# Patient Record
Sex: Male | Born: 1980 | Race: White | Hispanic: No | Marital: Married | Smoking: Never smoker
Health system: Southern US, Community
[De-identification: ages and names within clinical notes are randomized; demographics above are authoritative.]

## PROBLEM LIST (undated history)

## (undated) DIAGNOSIS — E785 Hyperlipidemia, unspecified: Secondary | ICD-10-CM

## (undated) DIAGNOSIS — I10 Essential (primary) hypertension: Secondary | ICD-10-CM

## (undated) HISTORY — PX: TONSILLECTOMY: SUR1361

## (undated) HISTORY — DX: Hyperlipidemia, unspecified: E78.5

## (undated) HISTORY — DX: Essential (primary) hypertension: I10

---

## 2007-05-07 ENCOUNTER — Encounter: Payer: Self-pay | Admitting: Family Medicine

## 2007-06-02 ENCOUNTER — Ambulatory Visit: Payer: Self-pay | Admitting: Family Medicine

## 2007-06-02 DIAGNOSIS — E785 Hyperlipidemia, unspecified: Secondary | ICD-10-CM | POA: Insufficient documentation

## 2007-06-02 DIAGNOSIS — I10 Essential (primary) hypertension: Secondary | ICD-10-CM | POA: Insufficient documentation

## 2007-06-02 LAB — CONVERTED CEMR LAB
CO2: 28 meq/L (ref 19–32)
GFR calc Af Amer: 130 mL/min
GFR calc non Af Amer: 108 mL/min
Glucose, Bld: 99 mg/dL (ref 70–99)
Sodium: 139 meq/L (ref 135–145)

## 2007-11-25 ENCOUNTER — Ambulatory Visit: Payer: Self-pay | Admitting: Family Medicine

## 2007-11-30 ENCOUNTER — Ambulatory Visit: Payer: Self-pay | Admitting: Family Medicine

## 2007-12-03 LAB — CONVERTED CEMR LAB: Direct LDL: 85.2 mg/dL

## 2008-05-30 ENCOUNTER — Ambulatory Visit: Payer: Self-pay | Admitting: Family Medicine

## 2008-05-30 LAB — CONVERTED CEMR LAB
Cholesterol, target level: 200 mg/dL
LDL Goal: 130 mg/dL

## 2008-09-05 ENCOUNTER — Ambulatory Visit: Payer: Self-pay | Admitting: Family Medicine

## 2008-09-06 ENCOUNTER — Encounter (INDEPENDENT_AMBULATORY_CARE_PROVIDER_SITE_OTHER): Payer: Self-pay | Admitting: *Deleted

## 2008-09-06 LAB — CONVERTED CEMR LAB
CO2: 28 meq/L (ref 19–32)
Calcium: 9.6 mg/dL (ref 8.4–10.5)
Chloride: 106 meq/L (ref 96–112)
Cholesterol: 166 mg/dL (ref 0–200)
Creatinine, Ser: 1.1 mg/dL (ref 0.4–1.5)
Glucose, Bld: 92 mg/dL (ref 70–99)
Sodium: 140 meq/L (ref 135–145)
Total CHOL/HDL Ratio: 5
Triglycerides: 284 mg/dL — ABNORMAL HIGH (ref 0.0–149.0)

## 2008-12-12 ENCOUNTER — Ambulatory Visit: Payer: Self-pay | Admitting: Family Medicine

## 2008-12-14 LAB — CONVERTED CEMR LAB
Cholesterol: 142 mg/dL (ref 0–200)
LDL Cholesterol: 79 mg/dL (ref 0–99)
Total CHOL/HDL Ratio: 4
Triglycerides: 123 mg/dL (ref 0.0–149.0)
VLDL: 24.6 mg/dL (ref 0.0–40.0)

## 2010-02-05 ENCOUNTER — Emergency Department: Payer: Self-pay | Admitting: Emergency Medicine

## 2010-02-06 ENCOUNTER — Telehealth (INDEPENDENT_AMBULATORY_CARE_PROVIDER_SITE_OTHER): Payer: Self-pay | Admitting: *Deleted

## 2010-02-16 ENCOUNTER — Telehealth (INDEPENDENT_AMBULATORY_CARE_PROVIDER_SITE_OTHER): Payer: Self-pay | Admitting: *Deleted

## 2010-02-19 ENCOUNTER — Ambulatory Visit: Payer: Self-pay | Admitting: Family Medicine

## 2010-02-20 ENCOUNTER — Ambulatory Visit: Payer: Self-pay | Admitting: Family Medicine

## 2010-02-20 DIAGNOSIS — E875 Hyperkalemia: Secondary | ICD-10-CM | POA: Insufficient documentation

## 2010-02-21 LAB — CONVERTED CEMR LAB
ALT: 41 units/L (ref 0–53)
AST: 25 units/L (ref 0–37)
Alkaline Phosphatase: 70 units/L (ref 39–117)
Bilirubin, Direct: 0.1 mg/dL (ref 0.0–0.3)
CO2: 27 meq/L (ref 19–32)
Chloride: 101 meq/L (ref 96–112)
Cholesterol: 155 mg/dL (ref 0–200)
Potassium: 5.4 meq/L — ABNORMAL HIGH (ref 3.5–5.1)
Total Bilirubin: 0.7 mg/dL (ref 0.3–1.2)
Total CHOL/HDL Ratio: 4
Total Protein: 7.2 g/dL (ref 6.0–8.3)
VLDL: 50.8 mg/dL — ABNORMAL HIGH (ref 0.0–40.0)

## 2010-02-22 LAB — CONVERTED CEMR LAB
BUN: 14 mg/dL (ref 6–23)
CO2: 27 meq/L (ref 19–32)
Calcium: 9.9 mg/dL (ref 8.4–10.5)
Creatinine, Ser: 1 mg/dL (ref 0.4–1.5)
GFR calc non Af Amer: 89.91 mL/min (ref >60)
Glucose, Bld: 94 mg/dL (ref 70–99)

## 2010-07-03 NOTE — Assessment & Plan Note (Signed)
Summary: CPX/CLE   Vital Signs:  Patient profile:   30 year old male Height:      71.5 inches Weight:      253.0 pounds BMI:     34.92 Temp:     98.3 degrees F oral Pulse rate:   92 / minute Pulse rhythm:   regular BP sitting:   110 / 80  (left arm) Cuff size:   large  Vitals Entered By: Benny Lennert CMA [facility] Dull) (February 20, 2010 2:47 PM)  History of Present Illness: Chief complaint  The patient is here for annual wellness exam and preventative care.     HTN, well controlled today..did not take lisinopril/HCTZ.  Last check 135/73 at home.   High cholesterol: Reviewed labs in detail with pt. Well controlled on fenofibrate. Triglycerides high again.  Just restarted fenofibrate.   Hyperkalemia..not on any supplements.no bannanas, has been trying to eat more veggies.   5lbs weight gain in last year. Not exercsiing..plans to start back.   Problems Prior to Update: 1)  Hyperkalemia  (ICD-276.7) 2)  Hypertension  (ICD-401.9) 3)  Hyperlipidemia  (ICD-272.4)  Current Medications (verified): 1)  Lisinopril-Hydrochlorothiazide 20-12.5 Mg  Tabs (Lisinopril-Hydrochlorothiazide) .... Take 1 Tablet By Mouth Once A Day 2)  Fish Oil   Oil (Fish Oil) .... Take 1 Capsule By Mouth Three Times A Day 3)  Antihistamine 30-2.5 Mg  Tabs (Triprolidine-Pseudoephedrine) .... Take 1 Tablet By Mouth Once A Day (Seasonal) 4)  Fenofibrate 160 Mg Tabs (Fenofibrate) .... Take 1 Tab By Mouth At Bedtime  Allergies (verified): No Known Drug Allergies  Past History:  Past medical, surgical, family and social histories (including risk factors) reviewed, and no changes noted (except as noted below).  Past Medical History: Reviewed history from 06/02/2007 and no changes required. Hyperlipidemia Hypertension  Past Surgical History: Reviewed history from 06/02/2007 and no changes required. pneumonia as a child 3 times Tonsillectomy  Family History: Reviewed history from 06/02/2007 and no  changes required. father CAD, stent placed, HTN, high chol,  mothers: healthy MGF: DM PGF: HTN, CAD MGF: pancreatic cancer MGM: Alzheimer's brother: HTN  Social History: Reviewed history from 06/02/2007 and no changes required. Occupation:golf professional at [employer] Single Current Smoker, chews tobacco Alcohol use-yes, 3-4 beers per week Drug use-no Regular exercise-yes, cardio 4 x per week  Review of Systems General:  Denies fatigue and fever. CV:  Denies chest pain or discomfort. Resp:  Denies shortness of breath. GI:  Denies abdominal pain, bloody stools, constipation, and diarrhea. GU:  Denies dysuria. Derm:  Denies lesion(s). Psych:  Denies anxiety and depression.  Physical Exam  General:  overweight appearing male in NAD Eyes:  No corneal or conjunctival inflammation noted. EOMI. Perrla. Funduscopic exam benign, without hemorrhages, exudates or papilledema. Vision grossly normal. Ears:  External ear exam shows no significant lesions or deformities.  Otoscopic examination reveals clear canals, tympanic membranes are intact bilaterally without bulging, retraction, inflammation or discharge. Hearing is grossly normal bilaterally. Nose:  External nasal examination shows no deformity or inflammation. Nasal mucosa are pink and moist without lesions or exudates. Mouth:  Oral mucosa and oropharynx without lesions or exudates.  Teeth in good repair. Neck:  no carotid bruit or thyromegaly no cervical or supraclavicular lymphadenopathy  Lungs:  Normal respiratory effort, chest expands symmetrically. Lungs are clear to auscultation, no crackles or wheezes. Heart:  Normal rate and regular rhythm. S1 and S2 normal without gallop, murmur, click, rub or other extra sounds. Abdomen:  Bowel sounds positive,abdomen soft  and non-tender without masses, organomegaly or hernias noted. Genitalia:  Testes bilaterally descended without nodularity, tenderness or masses. No scrotal masses or  lesions. No penis lesions or urethral discharge. Msk:  No deformity or scoliosis noted of thoracic or lumbar spine.   Pulses:  R and L posterior tibial pulses are full and equal bilaterally  Extremities:  no edema  Neurologic:  No cranial nerve deficits noted. Station and gait are normal. Plantar reflexes are down-going bilaterally. DTRs are symmetrical throughout. Sensory, motor and coordinative functions appear intact. Skin:  Intact without suspicious lesions or rashes Psych:  Cognition and judgment appear intact. Alert and cooperative with normal attention span and concentration. No apparent delusions, illusions, hallucinations   Impression & Recommendations:  Problem # 1:  Preventive Health Care (ICD-V70.0) The patient's preventative maintenance and recommended screening tests for an annual wellness exam were reviewed in full today. Brought up to date unless services declined.  Counselled on the importance of diet, exercise, and its role in overall health and mortality. The patient's FH and SH was reviewed, including their home life, tobacco status, and drug and alcohol status.     Problem # 2:  HYPERTENSION (ICD-401.9)  Well controlled. Continue current medication. Follow closely.Marland Kitchenif running low on BP med consider decreasing med/D/C. Folow up in 6 months.  Encouraged exercise, weight loss, healthy eating habits.  His updated medication list for this problem includes:    Lisinopril-hydrochlorothiazide 20-12.5 Mg Tabs (Lisinopril-hydrochlorothiazide) .Marland Kitchen... Take 1 tablet by mouth once a day  His updated medication list for this problem includes:    Lisinopril-hydrochlorothiazide 20-12.5 Mg Tabs (Lisinopril-hydrochlorothiazide) .Marland Kitchen... Take 1 tablet by mouth once a day  Problem # 3:  HYPERLIPIDEMIA (ICD-272.4)  Inadequate control. Restart med. Recheck in 1 year.  His updated medication list for this problem includes:    Fenofibrate 160 Mg Tabs (Fenofibrate) .Marland Kitchen... Take 1 tab by mouth  at bedtime  Labs Reviewed: SGOT: 23 (12/12/2008)   SGPT: 27 (12/12/2008)  Lipid Goals: Chol Goal: 200 (05/30/2008)   HDL Goal: 40 (05/30/2008)   LDL Goal: 130 (05/30/2008)   TG Goal: 150 (05/30/2008)  Prior 10 Yr Risk Heart Disease: 4 % (05/30/2008)   HDL:38.00 (12/12/2008), 36.40 (09/05/2008)  LDL:79 (12/12/2008), DEL (16/03/9603)  Chol:142 (12/12/2008), 166 (09/05/2008)  Trig:123.0 (12/12/2008), 284.0 (09/05/2008)  His updated medication list for this problem includes:    Fenofibrate 160 Mg Tabs (Fenofibrate) .Marland Kitchen... Take 1 tab by mouth at bedtime  Problem # 4:  HYPERKALEMIA (ICD-276.7) ? lisinopril cause. Recheck today.  May need kayexylate.  Orders: TLB-BMP (Basic Metabolic Panel-BMET) (80048-METABOL)  Complete Medication List: 1)  Lisinopril-hydrochlorothiazide 20-12.5 Mg Tabs (Lisinopril-hydrochlorothiazide) .... Take 1 tablet by mouth once a day 2)  Fish Oil Oil (Fish oil) .... Take 1 capsule by mouth three times a day 3)  Antihistamine 30-2.5 Mg Tabs (Triprolidine-pseudoephedrine) .... Take 1 tablet by mouth once a day (seasonal) 4)  Fenofibrate 160 Mg Tabs (Fenofibrate) .... Take 1 tab by mouth at bedtime  Patient Instructions: 1)  Get back on track with exercise. 2)   Work on healthy low fat eating.  3)  Bring in BP measurement record to next appt.  4)  Please schedule a follow-up appointment in 6 months HTN check. 5)  We will call with potassium results to let you know if you need to change BP meds or take medicaiton to flush out potassium from you system.  6)    Prescriptions: FENOFIBRATE 160 MG TABS (FENOFIBRATE) Take 1 tab by mouth at  bedtime  #30 x 11   Entered and Authorized by:   Kerby Nora MD   Signed by:   Kerby Nora MD on 02/20/2010   Method used:   Electronically to        [facility]* (retail)       [address]       [place], [place]  [address]       Ph: [phone]       Fax: [phone]   RxID:   [id number] LISINOPRIL-HYDROCHLOROTHIAZIDE  20-12.5 MG  TABS (LISINOPRIL-HYDROCHLOROTHIAZIDE) Take 1 tablet by mouth once a day  #30 x 11   Entered and Authorized by:   Kerby Nora MD   Signed by:   Kerby Nora MD on 02/20/2010   Method used:   Electronically to        [facility]* (retail)       [address]       [place], [place]  [address]       Ph: [phone]       Fax: [phone]   RxID:   [id number]   Current Allergies (reviewed today): No known allergies   Flu Vaccine Next Due:  Refused TD Result Date:  01/01/2010 TD Result:  in ER  TD Next Due:  10 yr

## 2010-07-03 NOTE — Progress Notes (Signed)
Summary: FENOFIBRATE  Phone Note Refill Request Message from:  [facility] on February 06, 2010 11:42 AM  Refills Requested: Medication #1:  FENOFIBRATE 160 MG TABS Take 1 tab by mouth at bedtime.   Last Refilled: 06/24/2009 pt hasn't been seen since 2009, ok to refill?   Method Requested: Electronic Initial call taken by: Mervin Hack CMA Duncan Dull),  February 06, 2010 11:42 AM  Follow-up for Phone Call        Overdue for CPX with fasting lipids, CMET Dx 272.0 prior. Refill only untill date of appt made.  Follow-up by: Kerby Nora MD,  February 06, 2010 1:50 PM  Additional Follow-up for Phone Call Additional follow up Details #1::        Message left on cell phone voicemail advising patient as instructed.  Will wait to hear from him before medication is refilled.  Linde Gillis CMA [facility] Dull)  February 07, 2010 12:27 PM  Left another message on patient's cell phone to call back.  Sydell Axon LPN  February 08, 2010 11:14 AM Left message on mail to call back and schedule CPx. Kim Dance CMA [facility] Dull)  February 09, 2010 2:52 PM     Additional Follow-up for Phone Call Additional follow up Details #2::    Patient advised and transfered up front to schedule appt refilled for 1 month only Follow-up by: Benny Lennert CMA [facility] Dull),  February 13, 2010 10:27 AM  Prescriptions: LISINOPRIL-HYDROCHLOROTHIAZIDE 20-12.5 MG  TABS (LISINOPRIL-HYDROCHLOROTHIAZIDE) Take 1 tablet by mouth once a day  #30 x 0   Entered by:   Benny Lennert CMA (AAMA)   Authorized by:   Kerby Nora MD   Signed by:   Benny Lennert CMA (AAMA) on 02/13/2010   Method used:   Electronically to        [facility]* (retail)       [address]       [place], [place]  [address]       Ph: [phone]       Fax: [phone]   RxID:   [id number] FENOFIBRATE 160 MG TABS (FENOFIBRATE) Take 1 tab by mouth at bedtime  #30 x 0   Entered by:   Benny Lennert CMA (AAMA)   Authorized by:   Kerby Nora MD   Signed by:    Benny Lennert CMA (AAMA) on 02/13/2010   Method used:   Electronically to        [facility]* (retail)       [address]       [place], [place]  [address]       Ph: [phone]       Fax: [phone]   RxID:   [id number]

## 2010-07-03 NOTE — Progress Notes (Signed)
----   Converted from flag ---- ---- 02/15/2010 9:59 AM, Kerby Nora MD wrote: CMET, lipids 272.0, 401.1  ---- 02/15/2010 9:36 AM, Liane Comber CMA (AAMA) wrote: Lab orders please! Good Morning! This pt is scheduled for cpx labs Monday, which labs to draw and dx codes to use? Thanks Tasha ------------------------------

## 2010-08-08 ENCOUNTER — Encounter: Payer: Self-pay | Admitting: Family Medicine

## 2010-08-10 ENCOUNTER — Ambulatory Visit: Payer: Self-pay | Admitting: Family Medicine

## 2010-09-24 ENCOUNTER — Ambulatory Visit (INDEPENDENT_AMBULATORY_CARE_PROVIDER_SITE_OTHER): Payer: PRIVATE HEALTH INSURANCE | Admitting: Family Medicine

## 2010-09-24 ENCOUNTER — Encounter: Payer: Self-pay | Admitting: Family Medicine

## 2010-09-24 DIAGNOSIS — J069 Acute upper respiratory infection, unspecified: Secondary | ICD-10-CM

## 2010-09-24 DIAGNOSIS — E785 Hyperlipidemia, unspecified: Secondary | ICD-10-CM

## 2010-09-24 DIAGNOSIS — I10 Essential (primary) hypertension: Secondary | ICD-10-CM

## 2010-09-24 NOTE — Patient Instructions (Signed)
Continue antihistamin for allergies. Try mucinex and nasal saline irrigation for congestion. Let us know if it is not continuing to improve.

## 2010-09-24 NOTE — Progress Notes (Signed)
  Subjective:    Patient ID: Albert Merritt, male    DOB: 12-01-1980, 30 y.o.   MRN: [id number]  URI  This is a new problem. The current episode started in the past 7 days. The problem has been gradually improving. There has been no fever. Associated symptoms include congestion, coughing, ear pain and sneezing. Pertinent negatives include no chest pain, dysuria, headaches, nausea or sinus pain. Associated symptoms comments: itchy eyes Left ear pain.Marland Kitchen He has tried antihistamine for the symptoms. The treatment provided moderate relief.    Hypertension:    Using medication without problems or lightheadedness:  Chest pain with exertion: None  Edema:None  Short of breath: None Average home BPs: 130/85 Other issues: None  Planning on starting grilled foods, less carbs. Plans to start going to [facility]  High triglycerides: Doing well back on fenofibrate no SE.     Review of Systems  Constitutional: Negative for fever and fatigue.  HENT: Positive for ear pain, congestion and sneezing.   Eyes: Negative for pain.  Respiratory: Positive for cough.   Cardiovascular: Negative for chest pain.  Gastrointestinal: Negative for nausea and abdominal distention.  Genitourinary: Negative for dysuria.  Neurological: Negative for headaches.       Objective:   Physical Exam  Constitutional: Vital signs are normal. He appears well-developed and well-nourished.  HENT:  Head: Normocephalic.  Right Ear: Hearing normal.  Left Ear: Hearing normal.  Nose: Nose normal.  Mouth/Throat: Oropharynx is clear and moist and mucous membranes are normal.  Neck: Trachea normal. Carotid bruit is not present. No mass and no thyromegaly present.  Cardiovascular: Normal rate, regular rhythm and normal pulses.  Exam reveals no gallop, no distant heart sounds and no friction rub.   No murmur heard.      No peripheral edema  Pulmonary/Chest: Effort normal and breath sounds normal. No respiratory distress.  Skin: Skin  is warm, dry and intact. No rash noted.  Psychiatric: He has a normal mood and affect. His speech is normal and behavior is normal. Thought content normal.          Assessment & Plan:

## 2010-09-24 NOTE — Assessment & Plan Note (Signed)
Last check 02/2010.  Back  Fenofibrate since last OV triglycerides were high.  LDL at goal last OV.

## 2011-02-22 ENCOUNTER — Other Ambulatory Visit: Payer: Self-pay | Admitting: *Deleted

## 2011-02-22 MED ORDER — FENOFIBRATE 160 MG PO TABS: 160.0000 mg | ORAL_TABLET | Freq: Every day | ORAL | Status: DC

## 2011-02-22 MED ORDER — LISINOPRIL-HYDROCHLOROTHIAZIDE 20-12.5 MG PO TABS: 1.0000 | ORAL_TABLET | Freq: Every day | ORAL | Status: DC

## 2011-03-25 ENCOUNTER — Other Ambulatory Visit: Payer: PRIVATE HEALTH INSURANCE

## 2011-03-28 ENCOUNTER — Other Ambulatory Visit (INDEPENDENT_AMBULATORY_CARE_PROVIDER_SITE_OTHER): Payer: PRIVATE HEALTH INSURANCE

## 2011-03-28 DIAGNOSIS — I10 Essential (primary) hypertension: Secondary | ICD-10-CM

## 2011-03-28 DIAGNOSIS — E785 Hyperlipidemia, unspecified: Secondary | ICD-10-CM

## 2011-03-28 LAB — COMPREHENSIVE METABOLIC PANEL
Alkaline Phosphatase: 55 U/L (ref 39–117)
CO2: 26 mEq/L (ref 19–32)
Creatinine, Ser: 1 mg/dL (ref 0.4–1.5)
GFR: 90.28 mL/min (ref >60.00)
Glucose, Bld: 97 mg/dL (ref 70–99)
Sodium: 141 mEq/L (ref 135–145)
Total Bilirubin: 0.7 mg/dL (ref 0.3–1.2)
Total Protein: 7.6 g/dL (ref 6.0–8.3)

## 2011-03-28 LAB — LIPID PANEL
Cholesterol: 180 mg/dL (ref 0–200)
HDL: 40.5 mg/dL (ref >39.00)
Total CHOL/HDL Ratio: 4
Triglycerides: 207 mg/dL — ABNORMAL HIGH (ref 0.0–149.0)
VLDL: 41.4 mg/dL — ABNORMAL HIGH (ref 0.0–40.0)

## 2011-04-01 ENCOUNTER — Ambulatory Visit (INDEPENDENT_AMBULATORY_CARE_PROVIDER_SITE_OTHER): Payer: PRIVATE HEALTH INSURANCE | Admitting: Family Medicine

## 2011-04-01 ENCOUNTER — Encounter: Payer: Self-pay | Admitting: Family Medicine

## 2011-04-01 VITALS — BP 130/80 | HR 92 | Temp 98.0°F | Ht 73.0 in | Wt 253.8 lb

## 2011-04-01 DIAGNOSIS — E785 Hyperlipidemia, unspecified: Secondary | ICD-10-CM

## 2011-04-01 DIAGNOSIS — Z0000 Encounter for general adult medical examination without abnormal findings: Secondary | ICD-10-CM

## 2011-04-01 DIAGNOSIS — I10 Essential (primary) hypertension: Secondary | ICD-10-CM

## 2011-04-01 DIAGNOSIS — K219 Gastro-esophageal reflux disease without esophagitis: Secondary | ICD-10-CM

## 2011-04-01 DIAGNOSIS — Z23 Encounter for immunization: Secondary | ICD-10-CM

## 2011-04-01 NOTE — Patient Instructions (Addendum)
Continue your current medicine. Work on adding back exercise as able.Marland Kitchen 3-5 time a week. Work on decreasing chewing tobacco or quitting all together.

## 2011-04-01 NOTE — Assessment & Plan Note (Signed)
Using omeprazole occ as needed. Symptoms well controlled.

## 2011-04-01 NOTE — Progress Notes (Signed)
Subjective:    Patient ID: Albert Merritt, male    DOB: July 04, 1980, 30 y.o.   MRN: [id number]  HPI  The patient is here for annual wellness exam and preventative care.    Hypertension:   At goal on lisinopril/HCTZ.  Using medication without problems or lightheadedness:  None Chest pain with exertion:None Edema:None Short of breath:None Average home BPs: 130/80 Other issues:  Elevated Cholesterol: LDL at goal, triglycerides remain high on fish oil and fenofibrate Using medications without problems: None Muscle aches: None Diet compliance:Improved, until recent vacation.  Exercise: Moderate.. Plans on increasing in winter.  Other complaints:    Review of Systems  Constitutional: Negative for fever, fatigue and unexpected weight change.  HENT: Negative for ear pain, congestion, sore throat, rhinorrhea, trouble swallowing and postnasal drip.   Eyes: Negative for pain.  Respiratory: Negative for cough, shortness of breath and wheezing.   Cardiovascular: Negative for chest pain, palpitations and leg swelling.  Gastrointestinal: Negative for nausea, abdominal pain, diarrhea, constipation and blood in stool.  Genitourinary: Negative for dysuria, urgency, hematuria, discharge, penile swelling, scrotal swelling, difficulty urinating, penile pain and testicular pain.  Skin: Negative for rash.  Neurological: Negative for syncope, weakness, light-headedness, numbness and headaches.  Psychiatric/Behavioral: Negative for behavioral problems and dysphoric mood. The patient is not nervous/anxious.        Objective:   Physical Exam  Constitutional: He appears well-developed and well-nourished.  Non-toxic appearance. He does not appear ill. No distress.  HENT:  Head: Normocephalic and atraumatic.  Right Ear: Hearing, tympanic membrane, external ear and ear canal normal.  Left Ear: Hearing, tympanic membrane, external ear and ear canal normal.  Nose: Nose normal.  Mouth/Throat: Uvula is  midline, oropharynx is clear and moist and mucous membranes are normal.  Eyes: Conjunctivae, EOM and lids are normal. Pupils are equal, round, and reactive to light. No foreign bodies found.  Neck: Trachea normal, normal range of motion and phonation normal. Neck supple. Carotid bruit is not present. No mass and no thyromegaly present.  Cardiovascular: Normal rate, regular rhythm, S1 normal, S2 normal, intact distal pulses and normal pulses.  Exam reveals no gallop.   No murmur heard. Pulmonary/Chest: Breath sounds normal. He has no wheezes. He has no rhonchi. He has no rales.  Abdominal: Soft. Normal appearance and bowel sounds are normal. There is no hepatosplenomegaly. There is no tenderness. There is no rebound, no guarding and no CVA tenderness. No hernia. Hernia confirmed negative in the right inguinal area and confirmed negative in the left inguinal area.  Genitourinary: Testes normal and penis normal. Right testis shows no mass and no tenderness. Left testis shows no mass and no tenderness. No paraphimosis or penile tenderness.  Lymphadenopathy:    He has no cervical adenopathy.       Right: No inguinal adenopathy present.       Left: No inguinal adenopathy present.  Neurological: He is alert. He has normal strength and normal reflexes. No cranial nerve deficit or sensory deficit. Gait normal.  Skin: Skin is warm, dry and intact. No rash noted.  Psychiatric: He has a normal mood and affect. His speech is normal and behavior is normal. Judgment normal.          Assessment & Plan:  Complete Physical Exam: The patient's preventative maintenance and recommended screening tests for an annual wellness exam were reviewed in full today. Brought up to date unless services declined.  Counselled on the importance of diet, exercise, and its role in  overall health and mortality. The patient's FH and SH was reviewed, including their home life, tobacco status, and drug and alcohol status.     Vaccines:  Refused flu, due for TDap.

## 2012-04-09 ENCOUNTER — Other Ambulatory Visit: Payer: Self-pay | Admitting: *Deleted

## 2012-04-09 MED ORDER — LISINOPRIL-HYDROCHLOROTHIAZIDE 20-12.5 MG PO TABS: 1.0000 | ORAL_TABLET | Freq: Every day | ORAL | Status: DC

## 2012-04-09 MED ORDER — FENOFIBRATE 160 MG PO TABS: 160.0000 mg | ORAL_TABLET | Freq: Every day | ORAL | Status: DC

## 2012-04-16 ENCOUNTER — Telehealth: Payer: Self-pay

## 2012-04-16 MED ORDER — LISINOPRIL-HYDROCHLOROTHIAZIDE 20-12.5 MG PO TABS: 1.0000 | ORAL_TABLET | Freq: Every day | ORAL | Status: DC

## 2012-04-16 MED ORDER — FENOFIBRATE 160 MG PO TABS: 160.0000 mg | ORAL_TABLET | Freq: Every day | ORAL | Status: DC

## 2012-04-16 NOTE — Telephone Encounter (Signed)
done

## 2012-04-16 NOTE — Telephone Encounter (Signed)
Occ to refill until CPX.

## 2012-04-16 NOTE — Telephone Encounter (Signed)
Pt changing insurance 06/03/12. Pt needs to wait until after first of year before seeing Dr Ermalene Searing; pt request refills for fenofibrate and lisinopril-HCTZ  To [facility] until CPX scheduled 07/28/12.Please advise.

## 2012-07-19 ENCOUNTER — Telehealth: Payer: Self-pay | Admitting: Family Medicine

## 2012-07-19 DIAGNOSIS — E785 Hyperlipidemia, unspecified: Secondary | ICD-10-CM

## 2012-07-19 NOTE — Telephone Encounter (Signed)
Message copied by Excell Seltzer on Sun Jul 19, 2012 12:35 AM ------      Message from: Alvina Chou      Created: Tue Jul 14, 2012 12:11 PM      Regarding: Lab orders for Monday, 2.17.14       Patient is scheduled for CPX labs, please order future labs, Thanks , Albert Merritt       ------

## 2012-07-20 ENCOUNTER — Other Ambulatory Visit (INDEPENDENT_AMBULATORY_CARE_PROVIDER_SITE_OTHER): Payer: Managed Care, Other (non HMO)

## 2012-07-20 DIAGNOSIS — E785 Hyperlipidemia, unspecified: Secondary | ICD-10-CM

## 2012-07-20 LAB — COMPREHENSIVE METABOLIC PANEL
Alkaline Phosphatase: 59 U/L (ref 39–117)
CO2: 26 mEq/L (ref 19–32)
Creatinine, Ser: 0.8 mg/dL (ref 0.4–1.5)
GFR: 112.02 mL/min (ref >60.00)
Glucose, Bld: 92 mg/dL (ref 70–99)
Sodium: 141 mEq/L (ref 135–145)
Total Bilirubin: 0.5 mg/dL (ref 0.3–1.2)
Total Protein: 7.5 g/dL (ref 6.0–8.3)

## 2012-07-20 LAB — LIPID PANEL
Cholesterol: 184 mg/dL (ref 0–200)
HDL: 42.3 mg/dL (ref >39.00)
Total CHOL/HDL Ratio: 4
Triglycerides: 324 mg/dL — ABNORMAL HIGH (ref 0.0–149.0)
VLDL: 64.8 mg/dL — ABNORMAL HIGH (ref 0.0–40.0)

## 2012-07-20 NOTE — Addendum Note (Signed)
Addended by: Liane Comber C on: 07/20/2012 09:00 AM   Modules accepted: Orders

## 2012-07-28 ENCOUNTER — Encounter: Payer: Self-pay | Admitting: Family Medicine

## 2012-07-28 ENCOUNTER — Ambulatory Visit (INDEPENDENT_AMBULATORY_CARE_PROVIDER_SITE_OTHER): Payer: Managed Care, Other (non HMO) | Admitting: Family Medicine

## 2012-07-28 VITALS — BP 128/84 | HR 75 | Temp 97.9°F | Ht 73.0 in | Wt 267.5 lb

## 2012-07-28 DIAGNOSIS — I10 Essential (primary) hypertension: Secondary | ICD-10-CM

## 2012-07-28 DIAGNOSIS — E785 Hyperlipidemia, unspecified: Secondary | ICD-10-CM

## 2012-07-28 NOTE — Assessment & Plan Note (Signed)
Will have a trail remaiining off med. Follow BP closely and work on lifestyle changes.

## 2012-07-28 NOTE — Patient Instructions (Addendum)
Restart fenofibrate. Increase fish oil to 3000-4000 mg daily. Hold lisinopril HCTZ and follow BPs daily to every few days. Call if > or equal to 140/90 x 3 measurement. Return for recheck on the labs in 3 months, fasting.  Stop chewing tobacco. Work on weight loss, exercise and healthy low fat eating.

## 2012-07-28 NOTE — Progress Notes (Signed)
The patient is here for annual wellness exam and preventative care.   In last 3 months he lost his job. No depression and anxiety. He is looking for a job. Works as Customer service manager.  Hypertension: At goal on lisinopril/HCTZ despite he has not been taking BP med for 3 week. Using medication without problems or lightheadedness: None  Chest pain with exertion:None  Edema:None  Short of breath:None  Average home BPs: 120/80 Other issues:   Elevated Cholesterol: LDL at goal, triglycerides are higher since he stopped  fenofibrate 3 weeks ago.  Still on fish oil 1000 mg twice a day. Lab Results  Component Value Date   CHOL 184 07/20/2012   HDL 42.30 07/20/2012   LDLCALC 79 12/12/2008   LDLDIRECT 82.4 07/20/2012   TRIG 324.0* 07/20/2012   CHOLHDL 4 07/20/2012    Using medications without problems: None  Muscle aches: None  Diet compliance:Improved Exercise: Moderate..Going to [employer].. Plans 3-4 times a week Other complaints:    GERD controlled on omeprazole prn.  Review of Systems  Constitutional: Negative for fever, fatigue and unexpected weight change.  HENT: Negative for ear pain, congestion, sore throat, rhinorrhea, trouble swallowing and postnasal drip.  Eyes: Negative for pain.  Respiratory: Negative for cough, shortness of breath and wheezing.  Cardiovascular: Negative for chest pain, palpitations and leg swelling.  Gastrointestinal: Negative for nausea, abdominal pain, diarrhea, constipation and blood in stool.  Genitourinary: Negative for dysuria, urgency, hematuria, discharge, penile swelling, scrotal swelling, difficulty urinating, penile pain and testicular pain.  Skin: Negative for rash.  Neurological: Negative for syncope, weakness, light-headedness, numbness and headaches.  Psychiatric/Behavioral: Negative for behavioral problems and dysphoric mood. The patient is not nervous/anxious.  Objective:   Physical Exam  Constitutional: He appears well-developed and  well-nourished. Non-toxic appearance. He does not appear ill. No distress.  HENT:  Head: Normocephalic and atraumatic.  Right Ear: Hearing, tympanic membrane, external ear and ear canal normal.  Left Ear: Hearing, tympanic membrane, external ear and ear canal normal.  Nose: Nose normal.  Mouth/Throat: Uvula is midline, oropharynx is clear and moist and mucous membranes are normal.  Eyes: Conjunctivae, EOM and lids are normal. Pupils are equal, round, and reactive to light. No foreign bodies found.  Neck: Trachea normal, normal range of motion and phonation normal. Neck supple. Carotid bruit is not present. No mass and no thyromegaly present.  Cardiovascular: Normal rate, regular rhythm, S1 normal, S2 normal, intact distal pulses and normal pulses. Exam reveals no gallop.  No murmur heard.  Pulmonary/Chest: Breath sounds normal. He has no wheezes. He has no rhonchi. He has no rales.  Abdominal: Soft. Normal appearance and bowel sounds are normal. There is no hepatosplenomegaly. There is no tenderness. There is no rebound, no guarding and no CVA tenderness. No hernia. Hernia confirmed negative in the right inguinal area and confirmed negative in the left inguinal area.  Genitourinary: Testes normal and penis normal. Right testis shows no mass and no tenderness. Left testis shows no mass and no tenderness. No paraphimosis or penile tenderness.  Lymphadenopathy:  He has no cervical adenopathy.  Right: No inguinal adenopathy present.  Left: No inguinal adenopathy present.  Neurological: He is alert. He has normal strength and normal reflexes. No cranial nerve deficit or sensory deficit. Gait normal.  Skin: Skin is warm, dry and intact. No rash noted.  Psychiatric: He has a normal mood and affect. His speech is normal and behavior is normal. Judgment normal.  Assessment & Plan:  Complete Physical Exam: The patient's preventative maintenance and recommended screening tests for an annual wellness exam  were reviewed in full today.  Brought up to date unless services declined.  Counselled on the importance of diet, exercise, and its role in overall health and mortality.  The patient's FH and SH was reviewed, including their home life, tobacco status, and drug and alcohol status.   Vaccines: Refused flu, due for TDap. No concern for STD risk. Tobacco abuse...chewing tobacco, precontemplative.  no family history of colon cancer history

## 2012-07-28 NOTE — Assessment & Plan Note (Signed)
Encouraged exercise, weight loss, healthy eating habits. Increase fish oil and restart fenofibrate.

## 2014-04-21 ENCOUNTER — Ambulatory Visit: Payer: Self-pay | Admitting: Podiatry

## 2014-04-29 ENCOUNTER — Ambulatory Visit: Payer: Self-pay | Admitting: Podiatry

## 2014-04-29 LAB — CREATININE, SERUM
Creatinine: 0.92 mg/dL (ref 0.60–1.30)
EGFR (African American): >60
EGFR (Non-African Amer.): >60

## 2014-09-24 NOTE — Op Note (Signed)
PATIENT NAME:  Albert Merritt, MALERBA MR#:  [id number] DATE OF BIRTH:  [birth year]  DATE OF PROCEDURE:  04/29/2014    PREOPERATIVE DIAGNOSIS: Joint depression right calcaneal fracture.   POSTOPERATIVE DIAGNOSIS: Joint depression right calcaneal fracture.   PROCEDURE: Open reduction with internal fixation of right calcaneal fracture.   ANESTHESIA: Spinal.   HEMOSTASIS: Thigh tourniquet inflated to 325 mmHg for 120 minutes.   COMPLICATIONS: None.   SPECIMEN: None.   OPERATIVE INDICATIONS: This is a 34 year old gentleman who fell from a height of approximately 10 feet from a scaffold.  He sustained a right calcaneus joint depression fracture. We discussed conservative and surgical intervention and he presents today for surgery.  All risks, benefits, alternatives, and complications associated with surgery were discussed with the patient and full consent has been given.   OPERATIVE PROCEDURE: The patient was brought into the OR and placed on the operating table in the supine position.  Spinal sedation was initially placed prior to placement in the supine position. The patient was then sedated and placed in the lateral decubitus position with the right side elevated.  The right lower extremity was then prepped and draped in the usual sterile fashion.  After inflation of the tourniquet, a lateral extensile incision was made from superior to the calcaneus extending to just proximal to the calcaneocuboid joint region.  Full thickness incision was made down to bone. The flap was elevated and a no touch technique was performed with 3 K wires individually driven into the fibula, talus and cuboid region. At this time, there was noted to be a severe a joint depression and impaction fracture of the calcaneus. There was noted to be a large lateral wall blowout. The lateral portion of the subtalar joint was markedly depressed into the body of the tuber in approximately a 45 degrees angle to its normal position to the  sagittal plane.  I removed the lateral wall blowout.  The lateral portion of the posterior facet was then elevated.  It was markedly blocked from being placed in his normal position.  I was unable to remove it from the surgical field in toto.  Next, a Schanz pin was placed in the lateral aspect of the posterior portion of the tuber of the calcaneus. A disimpaction move was created. This was then placed in a more medial position, as it previously was directly under the fibula. A valgus rotation was also performed at this time. This was stabilized with 0.062 K wires. At this time, the lateral portion of the posterior facet was then placed back into the surgical wound.  I was able to visualize its placement, was anatomic directly in line with the medial sustentacular piece.  This was stabilized with K wires initially.  Further realignment of the lateral wall, as well as the anterior portion of the calcaneal fracture was then performed manually. Next, a large locking plate from the [employer] tray was placed to the lateral aspect of the calcaneus. This was noted to be in good alignment, stabilized with the anterior, posterior and superior portion of the fracture site.  Guidewires were then driven into the posterior facet that was loose. This was for initial stabilization. The plate was pressed directly against the lateral portion of the calcaneus in a good position.  Minor bending of the plate was needed for good anatomic positioning. At this time, 2 nonlocking screws were placed into the posterior facet through the plate and used to stabilize the posterior facet. The plate was noted to conform  to the lateral aspect of the calcaneus and the calcaneus was able to be brought further medial with this maneuver.  Axial view did not show any residual varus or lateral positioning of the calcaneus.  Bohler's angle was also maintained with this.  At this time, further screw placements were initially performed with nonlocking screws  to further contour the plate against the lateral calcaneus and multiple screws were then filled with locking screws surrounding this area.  Good stability was noted.  Multiple x-rays were performed, which showed good alignment in the axial and lateral views.  The subtalar joint was visualized with Broden's views.  The wound was then flushed with copious amounts of irrigation. Closure was performed with a 3-0 Vicryl for the deeper layers and a 3-0 nylon for the skin.  A small 7 French TLS drain was placed extending and exiting superior to the incision site.  A well compressive sterile dressing was then placed to the lateral aspect of the calcaneus. A 0.25% Marcaine block was placed around this area prior to final dressing placement.  He was then placed back in his equalizer walker boot.  He will be admitted to the floor for monitoring this evening.  We will plan to discharge tomorrow once pain is controlled. Pain will be managed through anesthesia as he had a spinal with narcotics.   I will see him next week for a dressing change.  He is to call if there are any issues over the weekend.     ____________________________ Argentina DonovanJustin A. Ether GriffinsFowler, DPM jaf:DT D: 04/29/2014 11:30:44 ET T: 04/29/2014 12:18:20 ET JOB#: [id number]  cc: Jill AlexandersJustin A. Ether GriffinsFowler, DPM, <Dictator> Eesa Justiss DPM ELECTRONICALLY SIGNED 05/08/2014 18:58

## 2014-09-24 NOTE — Discharge Summary (Signed)
PATIENT NAME:  Albert Merritt, Albert Merritt MR#:  [id number] DATE OF BIRTH:  Jan 27, 1981  DATE OF ADMISSION:  04/29/2014 DATE OF DISCHARGE:  04/30/2014  HISTORY OF PRESENT ILLNESS: The patient is a healthy 34 year old male who had a fall from 12 feet and landed on his right heel and had a comminution and fracture and dislocation to the right calcaneus. He underwent ORIF for repair of this by Dr. Ether GriffinsFowler yesterday. He was kept overnight in the hospital for observation and pain management.   PAST MEDICAL HISTORY: Unremarkable.   ALLERGIES: None.   HOME MEDICATIONS: Include Percocet 7.5 mg and 325, which he can take every 4 to 6 hours. His wife has a prescription for that at home already. He also takes an antihistamine. Recommended he take a stool softener when he gets home as well.   PHYSICAL EXAMINATION:  VITAL SIGNS: On evaluation this morning the day after his surgery, his vital signs are stable with temperature of 98, respirations 18, blood pressure 139/88, pulse is 96. Pain level while resting is 1 to 2, when he gets up and the foot is independent it goes to a 5 or 6, but quickly improves once he gets off it and elevates it.  RIGHT FOOT: Shows the boot and dressing are intact. There is a TLS drain intact which has about 5 mL of blood in it and this was the only vial taken.   CLINICAL IMPRESSION: The patient is stable at this point with the foot and with his vital signs. He is progressing well from an overall standpoint.   TREATMENT PLAN: Go ahead and pull the drain today and discharge home. Dr. Ether GriffinsFowler wrote a prescription for Percocet which his wife has already picked up. I have instructed them that he can take 1 or 2 tablets every 4 to 6 hours if needed for pain. They can also utilize Advil in the interim times if he has some pain associated with that. He is to take an aspirin every day and I recommended calf massages to his calf above his boot, 20 massage squeezes every hour that he is awake. He is to see  Dr. Ether GriffinsFowler next week for followup.     ____________________________ Rhona RaiderMatthew G. Mika Griffitts, DPM mgt:at D: 04/30/2014 10:41:20 ET T: 04/30/2014 11:50:53 ET JOB#: [id number]  cc: Rhona RaiderMatthew G. Eldana Isip, DPM, <Dictator> Epimenio SarinMATTHEW G Candee Hoon MD ELECTRONICALLY SIGNED 05/04/2014 8:23

## 2015-01-10 IMAGING — CT CT OF THE RIGHT ANKLE WITHOUT CONTRAST
4 of 7 series · 15 of 33 positions shown, 16 images · non-contrast
Comparison: None.

CLINICAL DATA: Fall from scaffolding on 04/09/2014, landing on the
right heel. Calcaneal fracture.

EXAM:
CT OF THE RIGHT ANKLE WITHOUT CONTRAST
TECHNIQUE: Multidetector CT imaging of the right ankle was performed according
to the standard protocol. Multiplanar CT image reconstructions were
also generated.

[Series 6: soft tissue thins 1.0 · axial · 0.27mm/px · z∈[+112,+226]mm · 6 of 162 slices shown]
[im 24/162  soft-tissue]
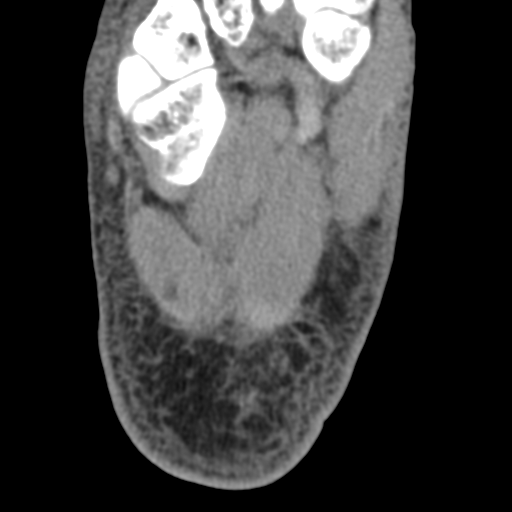
[im 47/162  soft-tissue]
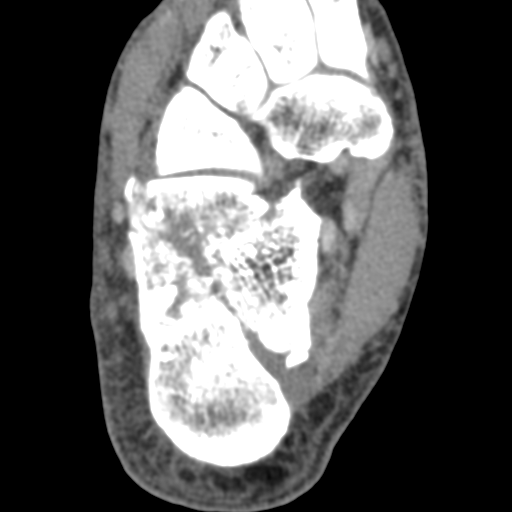
[im 70/162  soft-tissue]
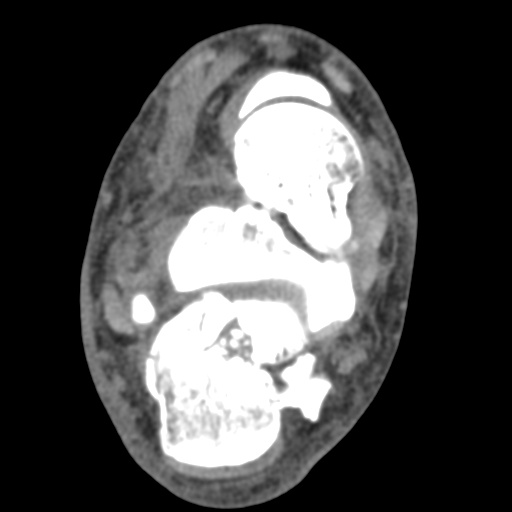
[im 93/162  soft-tissue]
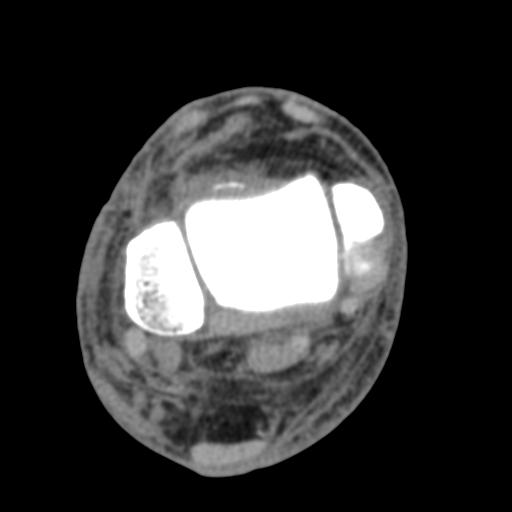
[im 116/162  soft-tissue]
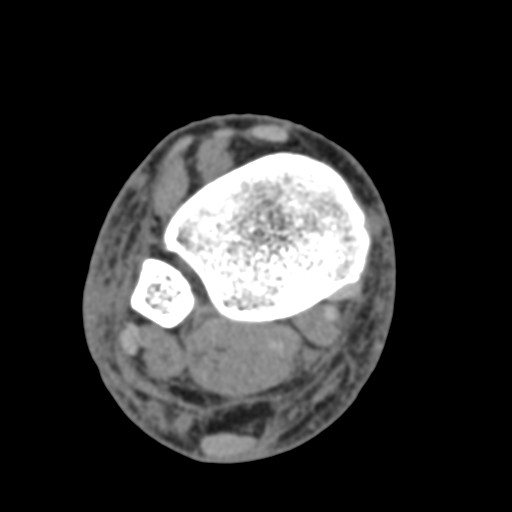
[im 139/162  soft-tissue]
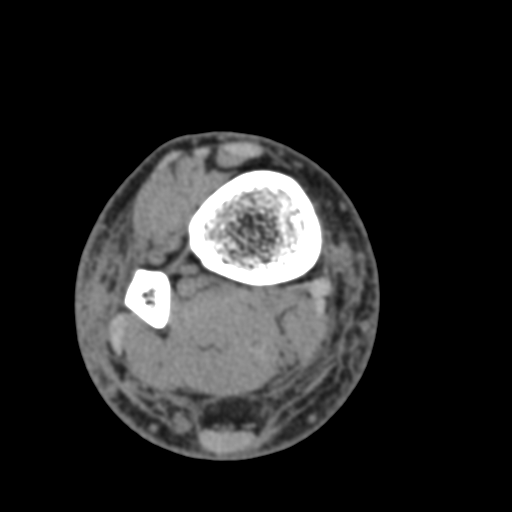

[Series 602: axial bone · axial · 0.32mm/px · z∈[+88,+192]mm · 3 of 54 slices shown, 4 images]
[im 1/54  soft-tissue]
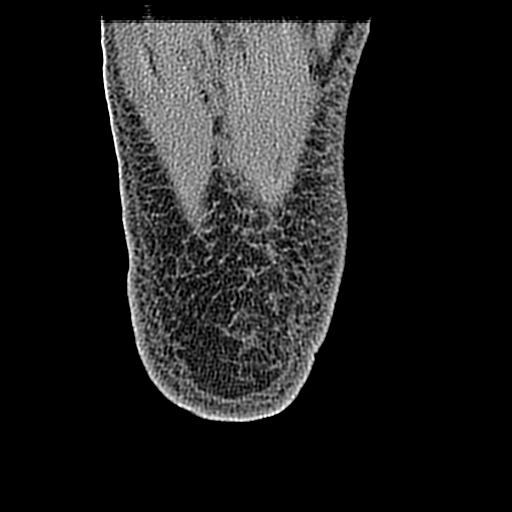
[im 1/54  bone]
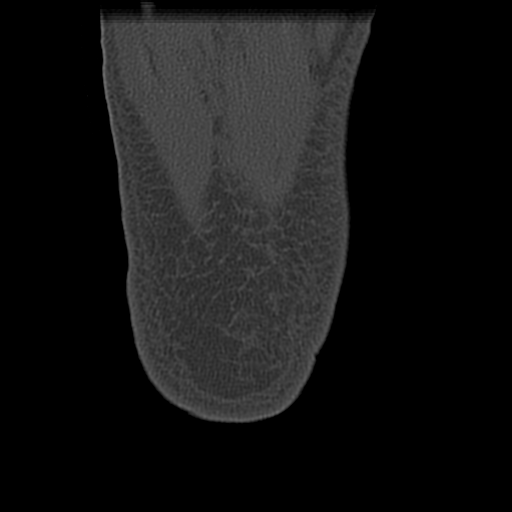
[im 27/54  bone]
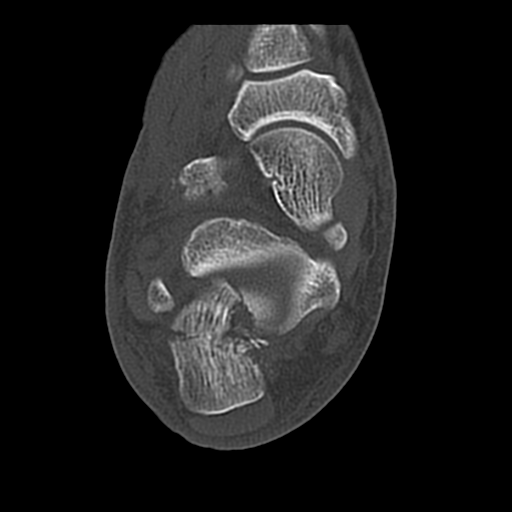
[im 54/54  bone]
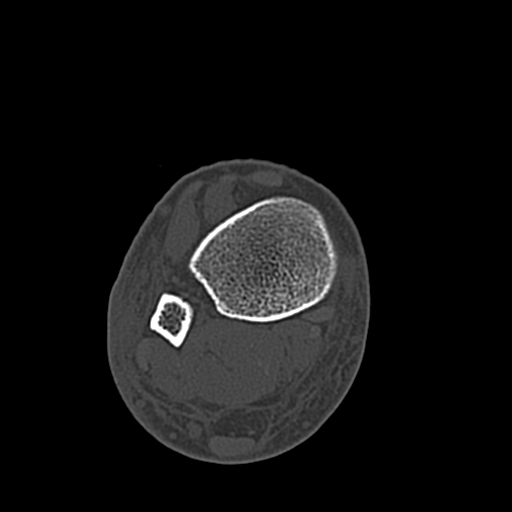

[Series 603: coronal bone · coronal · 0.32mm/px · 1 of 63 slices shown]
[im 32/63  bone]
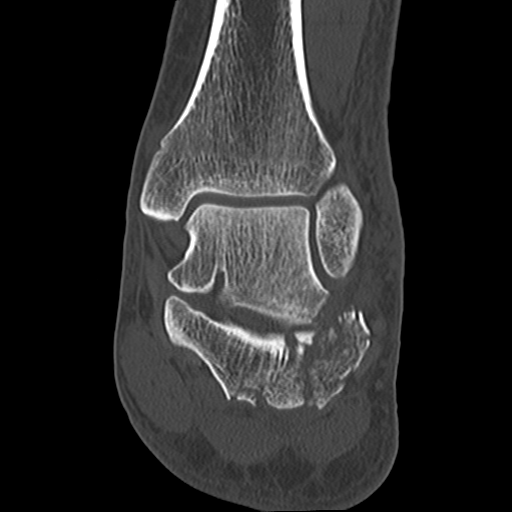

[Series 604: sagittal bone · sagittal · 0.32mm/px · 5 of 43 slices shown]
[im 8/43  bone]
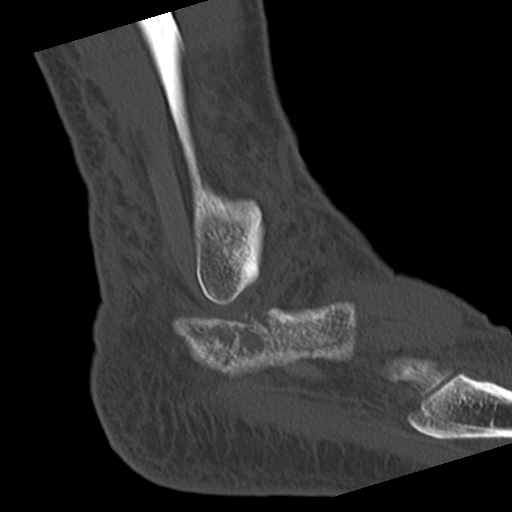
[im 15/43  bone]
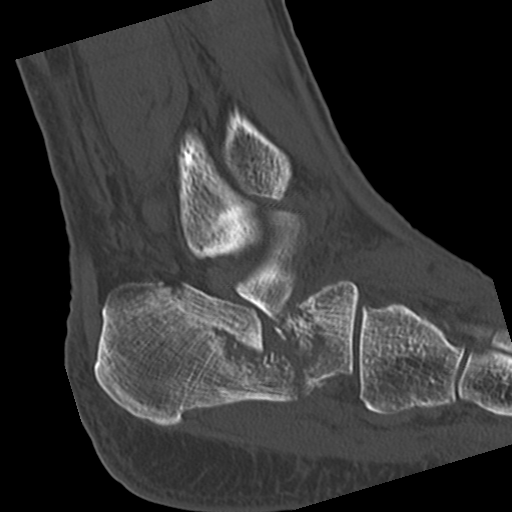
[im 22/43  bone]
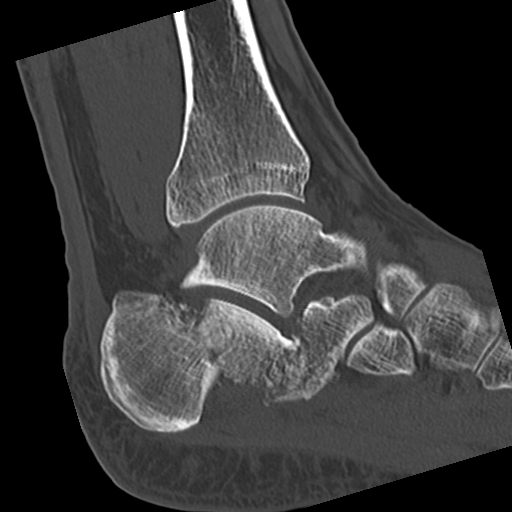
[im 29/43  bone]
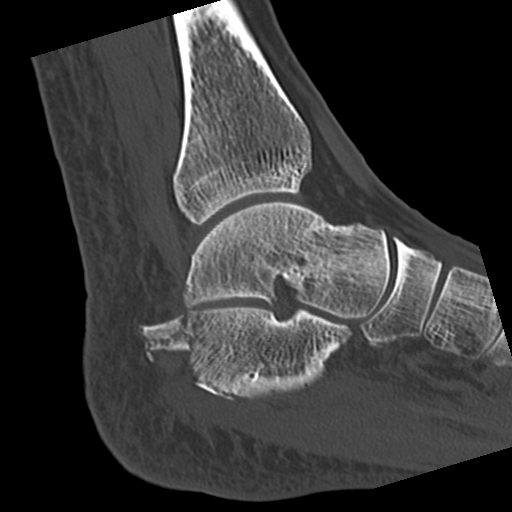
[im 36/43  bone]
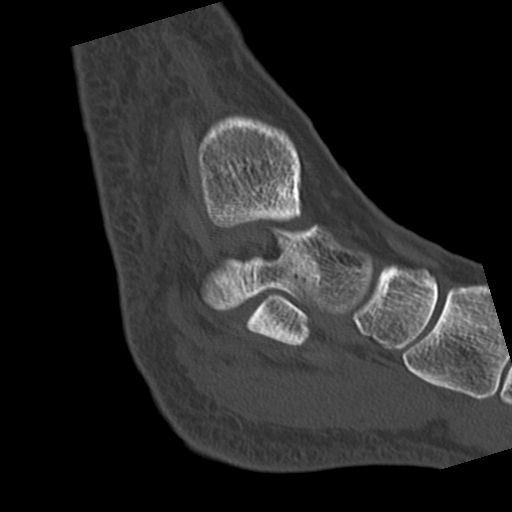

[15 of 33 positions shown; findings below may reference images not displayed]

FINDINGS: Comminuted and impacted calcaneal fracture observed with fracture
planes extending into the posterior subtalar facet, anterior process
of the calcaneus, lateral margin of the calcaneocuboid articulation,
and sinus tarsi region. There is significant depression of the
calcaneal fragments with fracture planes extending obliquely from
the posterior superior calcaneus to the inferior mid calcaneal body,
through the calcaneal neck, and into the anterior process. One of
the larger fragments including the sustentaculum tali measures
by 2.4 by 5.2 cm.

The peroneus longus and brevis extend adjacent to some of the
calcaneal fracture planes, but there is no tendon entrapment.

No significant fracture of the distal tibia, distal fibula, talus,
navicular, or cuboid identified. No discontinuity in the visualized
part of the Lisfranc joint, although the entirety of the Lisfranc
joint was not included. The visualized midfoot structures appear
intact.

As expected there is regional subcutaneous edema.
IMPRESSION: 1. Comminuted and depressed fracture of the calcaneus numerous
fragments, without tendon entrapment identified. No fractures
involving the remaining hindfoot or midfoot structures. Fractures
extend into the posterior subtalar facet, the anterior process of
the calcaneus, and lateral calcaneal margin of the calcaneocuboid
articulation.

## 2015-01-18 IMAGING — XA DG C-ARM 61-120 MIN-NO REPORT
3 series · 3 of 3 positions shown · non-contrast
Comparison: CT right ankle 04/21/2014.

CLINICAL DATA: Fracture reduction/fixation.

EXAM:
DG HEEL 2V RIGHT; DG C-ARM 61-120 MIN-NO REPORT

[Series 6001: f1 · 1 of 1 slices shown]
[im 1/1]
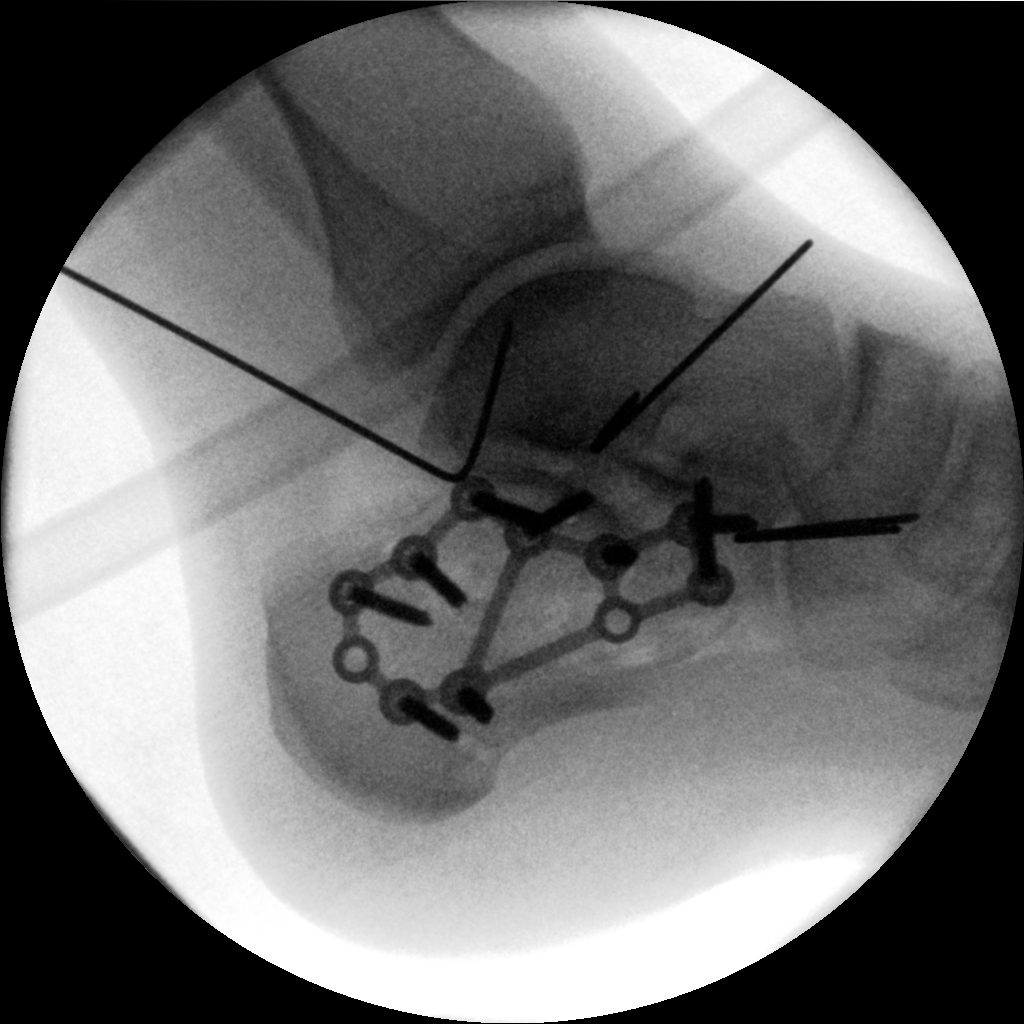

[Series 6002: f2 · 1 of 1 slices shown]
[im 1/1]
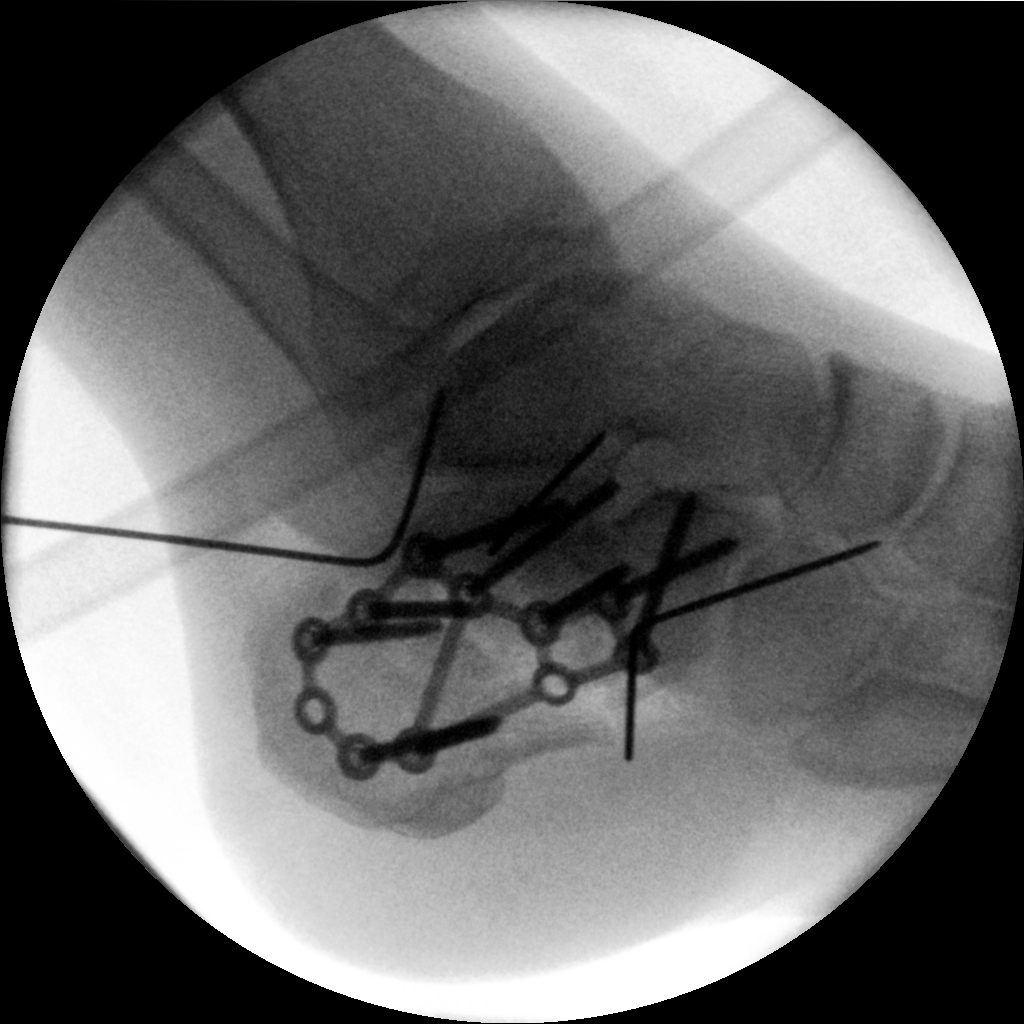

[Series 6003: f3 · 1 of 1 slices shown]
[im 1/1]
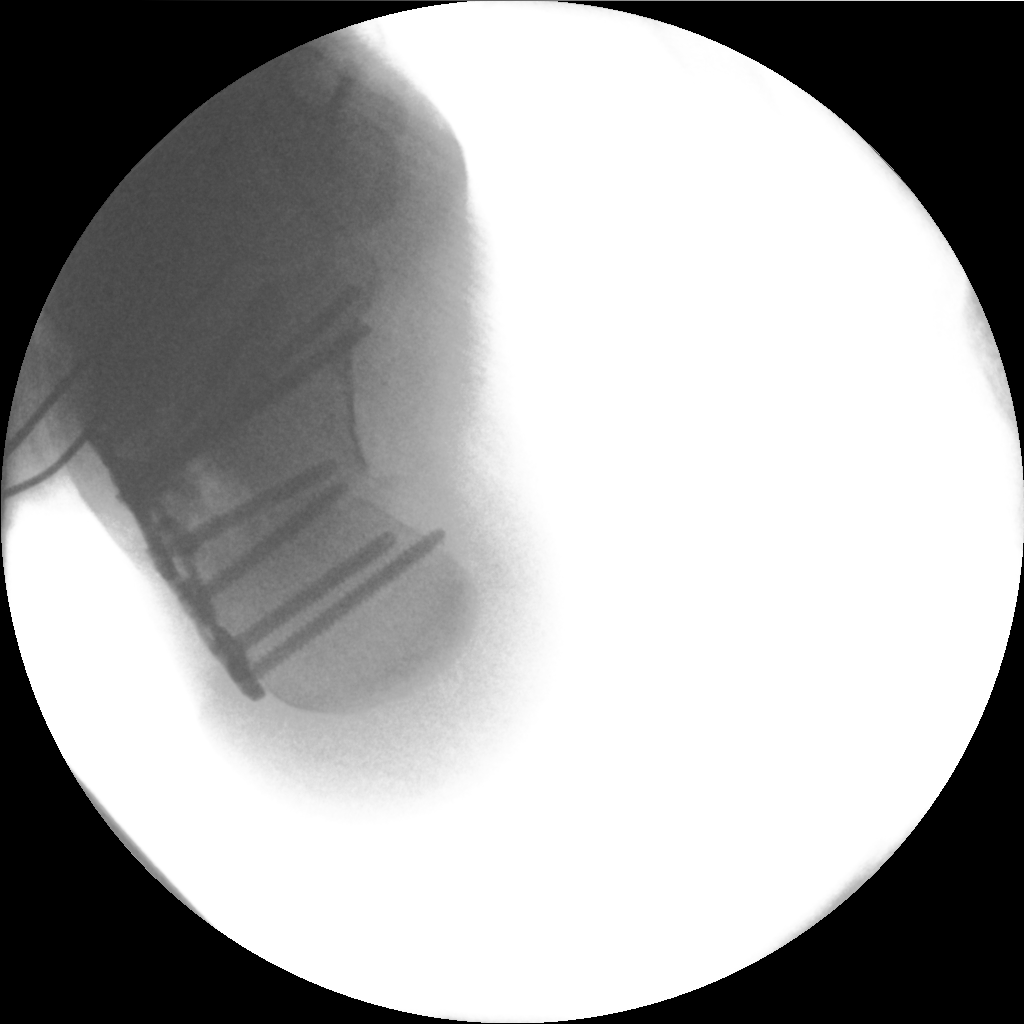

[3 of 3 positions shown; findings below may reference images not displayed]

FINDINGS: 3 fluoroscopic spot views of the right calcaneus show malleable
plate/screw fixation and pinning of a previously described highly
comminuted and complex calcaneal fracture.
IMPRESSION: Interval fixation of a highly comminuted and complex calcaneal
fracture.

## 2015-07-11 ENCOUNTER — Ambulatory Visit (INDEPENDENT_AMBULATORY_CARE_PROVIDER_SITE_OTHER): Payer: BLUE CROSS/BLUE SHIELD | Admitting: Family Medicine

## 2015-07-11 ENCOUNTER — Encounter: Payer: Self-pay | Admitting: Family Medicine

## 2015-07-11 VITALS — BP 126/88 | HR 80 | Temp 98.1°F | Ht 73.0 in | Wt 255.5 lb

## 2015-07-11 DIAGNOSIS — J069 Acute upper respiratory infection, unspecified: Secondary | ICD-10-CM | POA: Insufficient documentation

## 2015-07-11 DIAGNOSIS — B9789 Other viral agents as the cause of diseases classified elsewhere: Principal | ICD-10-CM

## 2015-07-11 NOTE — Progress Notes (Signed)
Pre visit review using our clinic review tool, if applicable. No additional management support is needed unless otherwise documented below in the visit note. 

## 2015-07-11 NOTE — Patient Instructions (Signed)
I think you have a viral upper respiratory infection It may get worse before it gets better Drink lots of fluids and rest when you can  Sudafed is ok for nasal congestion if it does not make heart race/ bp go up  Also nasal saline spray or a netti pot is helpful  Breathe steam  Ibuprofen helps aches/fever/ headaches / sinus congestion - always take it with food  mucinex DM is good to loosen congestion in head and chest and also suppress cough   Update if not starting to improve in a week or if worsening

## 2015-07-11 NOTE — Assessment & Plan Note (Signed)
Disc symptomatic care - see instructions on AVS  Reassuring exam  Handout given on viral uri   Update if not starting to improve in a week or if worsening  -esp if sinus pain or fever

## 2015-07-11 NOTE — Progress Notes (Signed)
Subjective:    Patient ID: Albert Merritt, male    DOB: 1980/06/09, 35 y.o.   MRN: [id number]  HPI Here for uri symptoms   Started Thursday night - runny nose  Then congestion in nose - clear nasal drainage  Some hoarseness  Then cough and chest congestion  Cough is not productive - feels rattly   No ST No fever or aches   Some facial pressure above eyes    Sudafed and ibuprofen are not working well   Patient Active Problem List   Diagnosis Date Noted  . GERD (gastroesophageal reflux disease) 04/01/2011  . HYPERLIPIDEMIA 06/02/2007  . HYPERTENSION 06/02/2007   Past Medical History  Diagnosis Date  . Hyperlipidemia   . Hypertension    Past Surgical History  Procedure Laterality Date  . Tonsillectomy     Social History  Substance Use Topics  . Smoking status: Never Smoker   . Smokeless tobacco: Current User    Types: Chew  . Alcohol Use: 0.0 oz/week    0 Standard drinks or equivalent per week     Comment: occ   Family History  Problem Relation Age of Onset  . Hypertension Father   . Hyperlipidemia Father   . Coronary artery disease Father   . Hypertension Brother   . Dementia Maternal Grandmother   . Diabetes Maternal Grandfather   . Hypertension Paternal Grandfather   . Coronary artery disease Paternal Grandfather    No Known Allergies Current Outpatient Prescriptions on File Prior to Visit  Medication Sig Dispense Refill  . fish oil-omega-3 fatty acids 1000 MG capsule Take 1 g by mouth 3 (three) times daily.      Marland Kitchen omeprazole (PRILOSEC) 20 MG capsule Take 20 mg by mouth daily as needed.      No current facility-administered medications on file prior to visit.     Review of Systems  Constitutional: Positive for appetite change and fatigue. Negative for fever.  HENT: Positive for congestion, postnasal drip, rhinorrhea, sinus pressure, sneezing and sore throat. Negative for ear pain.   Eyes: Negative for pain and discharge.  Respiratory: Positive  for cough. Negative for shortness of breath, wheezing and stridor.   Cardiovascular: Negative for chest pain.  Gastrointestinal: Negative for nausea, vomiting and diarrhea.  Genitourinary: Negative for urgency, frequency and hematuria.  Musculoskeletal: Negative for myalgias and arthralgias.  Skin: Negative for rash.  Neurological: Positive for headaches. Negative for dizziness, weakness and light-headedness.  Psychiatric/Behavioral: Negative for confusion and dysphoric mood.       Objective:   Physical Exam  Constitutional: He appears well-developed and well-nourished. No distress.  overwt and well appearing   HENT:  Head: Normocephalic and atraumatic.  Right Ear: External ear normal.  Left Ear: External ear normal.  Mouth/Throat: Oropharynx is clear and moist.  Nares are injected and congested  No sinus tenderness Clear rhinorrhea and post nasal drip   Eyes: Conjunctivae and EOM are normal. Pupils are equal, round, and reactive to light. Right eye exhibits no discharge. Left eye exhibits no discharge.  Neck: Normal range of motion. Neck supple.  Cardiovascular: Normal rate and normal heart sounds.   Pulmonary/Chest: Effort normal and breath sounds normal. No respiratory distress. He has no wheezes. He has no rales. He exhibits no tenderness.  Lymphadenopathy:    He has no cervical adenopathy.  Neurological: He is alert.  Skin: Skin is warm and dry. No rash noted.  Psychiatric: He has a normal mood and affect.  Assessment & Plan:   Problem List Items Addressed This Visit      Respiratory   Viral URI with cough - Primary    Disc symptomatic care - see instructions on AVS  Reassuring exam  Handout given on viral uri   Update if not starting to improve in a week or if worsening  -esp if sinus pain or fever

## 2016-07-25 ENCOUNTER — Encounter: Payer: Self-pay | Admitting: Family Medicine

## 2016-07-25 ENCOUNTER — Ambulatory Visit (INDEPENDENT_AMBULATORY_CARE_PROVIDER_SITE_OTHER): Payer: BLUE CROSS/BLUE SHIELD | Admitting: Family Medicine

## 2016-07-25 VITALS — BP 120/80 | HR 91 | Temp 98.1°F | Ht 71.0 in | Wt 264.0 lb

## 2016-07-25 DIAGNOSIS — M778 Other enthesopathies, not elsewhere classified: Secondary | ICD-10-CM | POA: Diagnosis not present

## 2016-07-25 DIAGNOSIS — E782 Mixed hyperlipidemia: Secondary | ICD-10-CM

## 2016-07-25 DIAGNOSIS — Z87891 Personal history of nicotine dependence: Secondary | ICD-10-CM

## 2016-07-25 DIAGNOSIS — K219 Gastro-esophageal reflux disease without esophagitis: Secondary | ICD-10-CM

## 2016-07-25 DIAGNOSIS — S46812A Strain of other muscles, fascia and tendons at shoulder and upper arm level, left arm, initial encounter: Secondary | ICD-10-CM | POA: Diagnosis not present

## 2016-07-25 DIAGNOSIS — Z0000 Encounter for general adult medical examination without abnormal findings: Secondary | ICD-10-CM

## 2016-07-25 DIAGNOSIS — I10 Essential (primary) hypertension: Secondary | ICD-10-CM

## 2016-07-25 DIAGNOSIS — M779 Enthesopathy, unspecified: Secondary | ICD-10-CM

## 2016-07-25 MED ORDER — DICLOFENAC SODIUM 75 MG PO TBEC: 75.0000 mg | DELAYED_RELEASE_TABLET | Freq: Two times a day (BID) | ORAL | 0 refills | Status: DC

## 2016-07-25 MED ORDER — CYCLOBENZAPRINE HCL 10 MG PO TABS: 10.0000 mg | ORAL_TABLET | Freq: Every evening | ORAL | 0 refills | Status: DC | PRN

## 2016-07-25 MED ORDER — OMEPRAZOLE 40 MG PO CPDR: 40.0000 mg | DELAYED_RELEASE_CAPSULE | Freq: Every day | ORAL | 3 refills | Status: DC | PRN

## 2016-07-25 NOTE — Progress Notes (Signed)
Subjective:    Patient ID: Albert Merritt, male    DOB: 03/10/1981, 36 y.o.   MRN: [id number]  HPI    36 year old male presents for annual wellness visit. I have not seem him since 2014.  2 year ago:  closed displaced fracture of rigth calcaneus. Dr. Ether Griffins Had surgery.   Hypertension:   Well controlled on no meds. BP Readings from Last 3 Encounters:  07/25/16 120/80  07/11/15 126/88  07/28/12 128/84   Using medication without problems or lightheadedness:  None Chest pain with exertion:none Edema: none Short of breath: none, except in ankle where there  Was surgery. Average home BPs: Good  control Other issues: Wt Readings from Last 3 Encounters:  07/25/16 264 lb (119.7 kg)  07/11/15 255 lb 8 oz (115.9 kg)  07/28/12 267 lb 8 oz (121.3 kg)    Elevated Cholesterol:  Due for re-eval. Lab Results  Component Value Date   CHOL 184 07/20/2012   HDL 42.30 07/20/2012   LDLCALC 79 12/12/2008   LDLDIRECT 82.4 07/20/2012   TRIG 324.0 (H) 07/20/2012   CHOLHDL 4 07/20/2012  Using medications without problems: Muscle aches:  Diet compliance:  healthy Exercise: 3-4 days each week in last  4 weeks At [facility] Other complaints: Body mass index is 36.82 kg/m.    GERD, well controlled on omeprazole, but has to use 40 mg at a time.   Left shoulder pain/ Left upper back : ocured in last 2 days. No known fall, no injury, he has recently measured some things and stretched arms out.  He has used ibuprofen  800 mg at a time.Marland Kitchen Helps some.  Cannot get comfortable at night, trouble sleeping.  Hx of bursitis in shoulder s/p injection.   Also when working out... during tricep pulldowns.. Has pain in left   No redness, no swelling in elbow. No ice, no heat. None at rest.    Social History /Family History/Past Medical History reviewed and updated if needed. Blood pressure 120/80, pulse 91, temperature 98.1 F (36.7 C), height 5\' 11"  (1.803 m), weight 264 lb (119.7 kg).   Review of  Systems  Constitutional: Negative for fatigue and fever.  HENT: Negative for ear pain.   Eyes: Negative for pain.  Respiratory: Negative for cough and shortness of breath.   Cardiovascular: Negative for chest pain, palpitations and leg swelling.  Gastrointestinal: Negative for abdominal pain.  Genitourinary: Negative for dysuria.  Musculoskeletal: Negative for arthralgias.  Neurological: Negative for syncope, light-headedness and headaches.  Psychiatric/Behavioral: Negative for dysphoric mood.       Objective:   Physical Exam  Constitutional: Vital signs are normal. He appears well-developed and well-nourished.  Non-toxic appearance. He does not appear ill. No distress.  HENT:  Head: Normocephalic and atraumatic.  Right Ear: Hearing, tympanic membrane, external ear and ear canal normal.  Left Ear: Hearing, tympanic membrane, external ear and ear canal normal.  Nose: Nose normal.  Mouth/Throat: Uvula is midline, oropharynx is clear and moist and mucous membranes are normal.  Eyes: Conjunctivae, EOM and lids are normal. Pupils are equal, round, and reactive to light. Lids are everted and swept, no foreign bodies found.  Neck: Trachea normal, normal range of motion and phonation normal. Neck supple. Carotid bruit is not present. No thyroid mass and no thyromegaly present.  Cardiovascular: Normal rate, regular rhythm, S1 normal, S2 normal, intact distal pulses and normal pulses.  Exam reveals no gallop, no distant heart sounds and no friction rub.   No  murmur heard. No peripheral edema  Pulmonary/Chest: Effort normal and breath sounds normal. No respiratory distress. He has no wheezes. He has no rhonchi. He has no rales.  Abdominal: Soft. Normal appearance and bowel sounds are normal. There is no hepatosplenomegaly. There is no tenderness. There is no rebound, no guarding and no CVA tenderness. No hernia.  Musculoskeletal:       Left shoulder: Normal. He exhibits normal range of motion, no  tenderness and no bony tenderness.       Left elbow: He exhibits normal range of motion. Tenderness found. Olecranon process tenderness noted. No radial head, no medial epicondyle and no lateral epicondyle tenderness noted.       Cervical back: He exhibits decreased range of motion and tenderness. He exhibits no bony tenderness.  Neg spurling  ttp over left trapezius Neg neer's, neg drop arm  ttp over posterior elbow at insertion of triceps tendon  no swelling, no redness, no ttp over medial or lateral epicondyle  Lymphadenopathy:    He has no cervical adenopathy.  Neurological: He is alert. He has normal strength and normal reflexes. No cranial nerve deficit or sensory deficit. Gait normal.  Skin: Skin is warm, dry and intact. No rash noted.  Psychiatric: He has a normal mood and affect. His speech is normal and behavior is normal. Judgment and thought content normal.          Assessment & Plan:  The patient's preventative maintenance and recommended screening tests for an annual wellness exam were reviewed in full today. Brought up to date unless services declined.  Counselled on the importance of diet, exercise, and its role in overall health and mortality. The patient's FH and SH was reviewed, including their home life, tobacco status, and drug and alcohol status.   Vaccines: Refused flu, uptodate TDap. No concern for STD risk. He has quit dipping! No family history of  early colon cancer or prostate history Brother with hodgkin's lymphoma.

## 2016-07-25 NOTE — Assessment & Plan Note (Signed)
Due for re-eval. Encouraged exercise, weight loss, healthy eating habits.  

## 2016-07-25 NOTE — Assessment & Plan Note (Signed)
Well controlled on prn omeprazole 40 mg at a time.

## 2016-07-25 NOTE — Progress Notes (Signed)
Pre visit review using our clinic review tool, if applicable. No additional management support is needed unless otherwise documented below in the visit note. 

## 2016-07-25 NOTE — Assessment & Plan Note (Addendum)
Resolved with weight loss and lifestyle changes. 

## 2016-07-25 NOTE — Patient Instructions (Addendum)
Work on [employer]healthy eating, weight loss and regular exercise.   Please stop at the lab to set up to have labs drawn.  Start diclofenac 75 mg BID for pain and inflammation.  Start heat, massage and home PT for neck and elbow.  Can use muscle relaxant at night as needed. No heavy lifting in upper ext x 2 weeks.  If not improving as expected can follow up with Dr. P[place]y [place] sports medicine

## 2016-07-26 ENCOUNTER — Telehealth: Payer: Self-pay | Admitting: Family Medicine

## 2016-07-26 LAB — COMPREHENSIVE METABOLIC PANEL
ALBUMIN: 4.7 g/dL (ref 3.5–5.2)
ALK PHOS: 81 U/L (ref 39–117)
ALT: 37 U/L (ref 0–53)
AST: 25 U/L (ref 0–37)
BILIRUBIN TOTAL: 0.3 mg/dL (ref 0.2–1.2)
BUN: 18 mg/dL (ref 6–23)
CALCIUM: 9.5 mg/dL (ref 8.4–10.5)
CO2: 27 mEq/L (ref 19–32)
CREATININE: 0.89 mg/dL (ref 0.40–1.50)
Chloride: 106 mEq/L (ref 96–112)
GFR: 102.36 mL/min (ref >60.00)
Glucose, Bld: 85 mg/dL (ref 70–99)
Potassium: 4.2 mEq/L (ref 3.5–5.1)
Sodium: 140 mEq/L (ref 135–145)
TOTAL PROTEIN: 7.4 g/dL (ref 6.0–8.3)

## 2016-07-26 LAB — LIPID PANEL
CHOLESTEROL: 213 mg/dL — AB (ref 0–200)
HDL: 33.5 mg/dL — AB (ref >39.00)
Total CHOL/HDL Ratio: 6

## 2016-07-26 LAB — LDL CHOLESTEROL, DIRECT: Direct LDL: 87 mg/dL

## 2016-07-26 MED ORDER — FENOFIBRATE 160 MG PO TABS: 160.0000 mg | ORAL_TABLET | Freq: Every day | ORAL | 5 refills | Status: DC

## 2016-07-26 NOTE — Telephone Encounter (Signed)
-----   Message from Damita Lackonna S Loring, CMA sent at 07/26/2016  1:47 PM EST ----- Christiane HaJonathan notified as instructed by telephone.  He is agreeable to starting a medication for his triglycerides.  [facility]  Lab appointment scheduled for 09/23/2016 at 7:45 am to recheck cholesterol.

## 2016-09-23 ENCOUNTER — Telehealth: Payer: Self-pay | Admitting: Family Medicine

## 2016-09-23 ENCOUNTER — Other Ambulatory Visit: Payer: BLUE CROSS/BLUE SHIELD

## 2016-09-23 DIAGNOSIS — E782 Mixed hyperlipidemia: Secondary | ICD-10-CM

## 2016-09-23 NOTE — Telephone Encounter (Signed)
-----   Message from Alvina Chou sent at 09/12/2016  2:52 PM EDT ----- Regarding: Lab orders for Monday, 4.23.18 Lab orders, no f/u appt

## 2016-10-22 ENCOUNTER — Encounter: Payer: Self-pay | Admitting: *Deleted

## 2016-10-22 ENCOUNTER — Other Ambulatory Visit (INDEPENDENT_AMBULATORY_CARE_PROVIDER_SITE_OTHER): Payer: BLUE CROSS/BLUE SHIELD

## 2016-10-22 DIAGNOSIS — E782 Mixed hyperlipidemia: Secondary | ICD-10-CM

## 2016-10-22 LAB — LIPID PANEL
CHOL/HDL RATIO: 5
CHOLESTEROL: 167 mg/dL (ref 0–200)
HDL: 34.3 mg/dL — ABNORMAL LOW (ref >39.00)
NonHDL: 133
TRIGLYCERIDES: 275 mg/dL — AB (ref 0.0–149.0)
VLDL: 55 mg/dL — ABNORMAL HIGH (ref 0.0–40.0)

## 2016-10-22 LAB — COMPREHENSIVE METABOLIC PANEL
ALBUMIN: 4.4 g/dL (ref 3.5–5.2)
ALT: 28 U/L (ref 0–53)
AST: 19 U/L (ref 0–37)
Alkaline Phosphatase: 69 U/L (ref 39–117)
BILIRUBIN TOTAL: 0.5 mg/dL (ref 0.2–1.2)
BUN: 24 mg/dL — ABNORMAL HIGH (ref 6–23)
CALCIUM: 9.4 mg/dL (ref 8.4–10.5)
CO2: 26 mEq/L (ref 19–32)
CREATININE: 0.94 mg/dL (ref 0.40–1.50)
Chloride: 103 mEq/L (ref 96–112)
GFR: 95.97 mL/min (ref >60.00)
Glucose, Bld: 99 mg/dL (ref 70–99)
Potassium: 4.7 mEq/L (ref 3.5–5.1)
Sodium: 137 mEq/L (ref 135–145)
Total Protein: 7.2 g/dL (ref 6.0–8.3)

## 2016-10-22 LAB — LDL CHOLESTEROL, DIRECT: LDL DIRECT: 81 mg/dL

## 2017-03-14 ENCOUNTER — Other Ambulatory Visit: Payer: Self-pay | Admitting: Family Medicine

## 2017-07-25 ENCOUNTER — Other Ambulatory Visit (INDEPENDENT_AMBULATORY_CARE_PROVIDER_SITE_OTHER): Payer: BLUE CROSS/BLUE SHIELD

## 2017-07-25 ENCOUNTER — Telehealth: Payer: Self-pay | Admitting: Family Medicine

## 2017-07-25 DIAGNOSIS — E782 Mixed hyperlipidemia: Secondary | ICD-10-CM

## 2017-07-25 LAB — COMPREHENSIVE METABOLIC PANEL
ALT: 39 U/L (ref 0–53)
AST: 18 U/L (ref 0–37)
Albumin: 4.4 g/dL (ref 3.5–5.2)
Alkaline Phosphatase: 67 U/L (ref 39–117)
BUN: 19 mg/dL (ref 6–23)
CHLORIDE: 108 meq/L (ref 96–112)
CO2: 27 meq/L (ref 19–32)
CREATININE: 0.92 mg/dL (ref 0.40–1.50)
Calcium: 9.4 mg/dL (ref 8.4–10.5)
GFR: 97.98 mL/min (ref >60.00)
GLUCOSE: 110 mg/dL — AB (ref 70–99)
Potassium: 4.7 mEq/L (ref 3.5–5.1)
SODIUM: 143 meq/L (ref 135–145)
Total Bilirubin: 0.4 mg/dL (ref 0.2–1.2)
Total Protein: 6.9 g/dL (ref 6.0–8.3)

## 2017-07-25 LAB — LIPID PANEL
CHOL/HDL RATIO: 4
CHOLESTEROL: 142 mg/dL (ref 0–200)
HDL: 32.7 mg/dL — ABNORMAL LOW (ref >39.00)
LDL CALC: 73 mg/dL (ref 0–99)
NonHDL: 109.52
Triglycerides: 183 mg/dL — ABNORMAL HIGH (ref 0.0–149.0)
VLDL: 36.6 mg/dL (ref 0.0–40.0)

## 2017-07-25 NOTE — Telephone Encounter (Signed)
-----   Message from Alvina Chouerri J Walsh sent at 07/15/2017  4:13 PM EST ----- Regarding: Lab orders for Friday, 2.22.19 Patient is scheduled for CPX labs, please order future labs, Thanks , Camelia Engerri

## 2017-07-29 ENCOUNTER — Ambulatory Visit (INDEPENDENT_AMBULATORY_CARE_PROVIDER_SITE_OTHER): Payer: BLUE CROSS/BLUE SHIELD | Admitting: Family Medicine

## 2017-07-29 ENCOUNTER — Encounter: Payer: Self-pay | Admitting: Family Medicine

## 2017-07-29 ENCOUNTER — Other Ambulatory Visit: Payer: Self-pay

## 2017-07-29 VITALS — BP 130/84 | HR 93 | Temp 98.5°F | Ht 71.0 in | Wt 281.8 lb

## 2017-07-29 DIAGNOSIS — M79671 Pain in right foot: Secondary | ICD-10-CM | POA: Diagnosis not present

## 2017-07-29 DIAGNOSIS — S92001S Unspecified fracture of right calcaneus, sequela: Secondary | ICD-10-CM

## 2017-07-29 DIAGNOSIS — E669 Obesity, unspecified: Secondary | ICD-10-CM

## 2017-07-29 DIAGNOSIS — I10 Essential (primary) hypertension: Secondary | ICD-10-CM | POA: Diagnosis not present

## 2017-07-29 DIAGNOSIS — R7303 Prediabetes: Secondary | ICD-10-CM | POA: Insufficient documentation

## 2017-07-29 DIAGNOSIS — E782 Mixed hyperlipidemia: Secondary | ICD-10-CM

## 2017-07-29 DIAGNOSIS — Z0000 Encounter for general adult medical examination without abnormal findings: Secondary | ICD-10-CM | POA: Diagnosis not present

## 2017-07-29 DIAGNOSIS — F411 Generalized anxiety disorder: Secondary | ICD-10-CM | POA: Insufficient documentation

## 2017-07-29 DIAGNOSIS — G8929 Other chronic pain: Secondary | ICD-10-CM

## 2017-07-29 MED ORDER — SERTRALINE HCL 50 MG PO TABS
50.0000 mg | ORAL_TABLET | Freq: Every day | ORAL | 3 refills | Status: DC
Start: 2017-07-29 — End: 2017-12-02

## 2017-07-29 NOTE — Assessment & Plan Note (Signed)
Blood pressure controlled with lifestyle off medication.

## 2017-07-29 NOTE — Assessment & Plan Note (Signed)
Good control on fish oil.

## 2017-07-29 NOTE — Patient Instructions (Addendum)
Please stop at the front desk to set up referral. Increase exercise as tolerated.  Decrease carbohydrates as able. Start sertraline at bedtime 50 mg daily.

## 2017-07-29 NOTE — Assessment & Plan Note (Signed)
GAD 7: 9 Will start sertraline at bedtime , follow up in 1 moth Not interested in counseling.

## 2017-07-29 NOTE — Progress Notes (Signed)
Subjective:    Patient ID: Albert SalisburyJonathan Ohlinger, male    DOB: 05-25-1981, 37 y.o.   MRN: [id number]  HPI The patient is here for annual wellness exam and preventative care.   He has been more stress out lately. Some increase in anxiety in last 6 months. Cannot relax at night,  trouble falling asleep,sleeps well but wakes up easier.  He has been working on stress reduction No sadness, no depression, no anhedonia, no decreased motivation.  He continues to have pain in right heel where he had past calcaneal fracture. Interested in referral for second opinion at different podiatry office on why pain still present. Cannot run on it, sore after moderate activity.  MD was Dr. Ether GriffinsFowler.   Hypertension: Good control on no med.  Using medication without problems or lightheadedness:  none Chest pain with exertion: Edema:none , except from past heel injury Short of breath:non Average home BPs: Other issues:  Elevated Cholesterol:  Good control of LDL, trig slightly high Lab Results  Component Value Date   CHOL 142 07/25/2017   HDL 32.70 (L) 07/25/2017   LDLCALC 73 07/25/2017   LDLDIRECT 81.0 10/22/2016   TRIG 183.0 (H) 07/25/2017   CHOLHDL 4 07/25/2017  Using medications without problems: Muscle aches:  Diet compliance: moderate Exercise: 2-3 times a week cycle Other complaints:  Social History /Family History/Past Medical History reviewed in detail and updated in EMR if needed. Blood pressure 130/84, pulse 93, temperature 98.5 F (36.9 C), temperature source Oral, height 5\' 11"  (1.803 m), weight 281 lb 12 oz (127.8 kg). Body mass index is 39.3 kg/m.  Review of Systems  Constitutional: Negative for fatigue and fever.  HENT: Negative for ear pain.   Eyes: Negative for pain.  Respiratory: Negative for cough and shortness of breath.   Cardiovascular: Negative for chest pain, palpitations and leg swelling.  Gastrointestinal: Negative for abdominal pain.  Genitourinary: Negative for  dysuria.  Musculoskeletal: Negative for arthralgias.  Neurological: Negative for syncope, light-headedness and headaches.  Psychiatric/Behavioral: Negative for dysphoric mood.       Objective:   Physical Exam  Constitutional: He appears well-developed and well-nourished.  Non-toxic appearance. He does not appear ill. No distress.  Obese appearing male in NAD.  HENT:  Head: Normocephalic and atraumatic.  Right Ear: Hearing, tympanic membrane, external ear and ear canal normal.  Left Ear: Hearing, tympanic membrane, external ear and ear canal normal.  Nose: Nose normal.  Mouth/Throat: Uvula is midline, oropharynx is clear and moist and mucous membranes are normal.  Eyes: Conjunctivae, EOM and lids are normal. Pupils are equal, round, and reactive to light. Lids are everted and swept, no foreign bodies found.  Neck: Trachea normal, normal range of motion and phonation normal. Neck supple. Carotid bruit is not present. No thyroid mass and no thyromegaly present.  Cardiovascular: Normal rate, regular rhythm, S1 normal, S2 normal, intact distal pulses and normal pulses. Exam reveals no gallop.  No murmur heard. Pulmonary/Chest: Breath sounds normal. He has no wheezes. He has no rhonchi. He has no rales.  Abdominal: Soft. Normal appearance and bowel sounds are normal. There is no hepatosplenomegaly. There is no tenderness. There is no rebound, no guarding and no CVA tenderness. No hernia.  Lymphadenopathy:    He has no cervical adenopathy.  Neurological: He is alert. He has normal strength and normal reflexes. No cranial nerve deficit or sensory deficit. Gait normal.  Skin: Skin is warm, dry and intact. No rash noted.  Psychiatric: He has a  normal mood and affect. His speech is normal and behavior is normal. Judgment normal.          Assessment & Plan:  The patient's preventative maintenance and recommended screening tests for an annual wellness exam were reviewed in full today. Brought  up to date unless services declined.  Counselled on the importance of diet, exercise, and its role in overall health and mortality. The patient's FH and SH was reviewed, including their home life, tobacco status, and drug and alcohol status.   Vaccines: Refused flu, uptodate TDap. No concern for STD risk. He has quit dipping! No family history of  early colon cancer or prostate history Brother with hodgkin's lymphoma.

## 2017-07-29 NOTE — Assessment & Plan Note (Signed)
Low carb diet Weight loss and exercise

## 2017-08-15 ENCOUNTER — Ambulatory Visit: Payer: Self-pay | Admitting: Podiatry

## 2017-08-26 ENCOUNTER — Ambulatory Visit: Payer: BLUE CROSS/BLUE SHIELD | Admitting: Podiatry

## 2017-08-26 ENCOUNTER — Encounter: Payer: Self-pay | Admitting: Podiatry

## 2017-08-26 ENCOUNTER — Ambulatory Visit (INDEPENDENT_AMBULATORY_CARE_PROVIDER_SITE_OTHER): Payer: BLUE CROSS/BLUE SHIELD

## 2017-08-26 DIAGNOSIS — S92001A Unspecified fracture of right calcaneus, initial encounter for closed fracture: Secondary | ICD-10-CM

## 2017-08-26 MED ORDER — MELOXICAM 15 MG PO TABS: 15.0000 mg | ORAL_TABLET | Freq: Every day | ORAL | 1 refills | Status: AC

## 2017-08-27 NOTE — Progress Notes (Signed)
   HPI: 37 year old otherwise healthy male presents to the office today for evaluation of right foot pain that is been going on for the past 3 years ever since surgery.  Patient initially fell 10 feet from scaffolding and sustained a right calcaneal fracture.  [facility] Ether GriffinsFowler.  Patient states that slowly over the past 3 years she developed pain with swelling to the right foot.  He has not done anything recently to alleviate any of his symptoms.  He presents today for further treatment and evaluation  Past Medical History:  Diagnosis Date  . Hyperlipidemia   . Hypertension      Physical Exam: General: The patient is alert and oriented x3 in no acute distress.  Dermatology: Skin is warm, dry and supple bilateral lower extremities. Negative for open lesions or macerations.  Vascular: Palpable pedal pulses bilaterally. No edema or erythema noted. Capillary refill within normal limits.  Neurological: Epicritic and protective threshold grossly intact bilaterally.   Musculoskeletal Exam: Pain on palpation to the right calcaneus as well as pain with inversion eversion of the subtalar joint.  There is significant amount of limited motion to the subtalar joint consistent with a posttraumatic arthrosis. Range of motion within normal limits to all other pedal and ankle joints bilateral. Muscle strength 5/5 in all groups bilateral.   Radiographic Exam:  Normal osseous mineralization.  degenerative changes with joint space narrowing and posttraumatic arthritic changes noted to the subtalar joint right foot.  Orthopedic hardware appears to be intact.  Assessment: -Posttraumatic arthrosis subtalar joint right foot -History of calcaneal fracture ORIF x 3 years -Chronic right foot edema secondary to trauma and surgical reconstruction   Plan of Care:  - Patient evaluated. X-Rays reviewed.  -Today we discussed conservative modalities to alleviate the patient's symptoms  including compression socks, good supportive shoe gear, orthotics to control the subtalar joint motion -I informed the patient that if he ever has acute flareups regarding pain anti-inflammatory injections may be beneficial -Patient has chronic pain with complications, recommend subtalar joint fusion with removal of hardware.  I explained the procedure in detail to the patient.  However at the moment we are going to continue to pursue conservative treatment. -Return to clinic as needed    Felecia ShellingBrent M. Saif Peter, DPM [facility]  Dr. Felecia ShellingBrent M. Eloisa Chokshi, DPM    [address]                                        [place], [place] [id number]                Office [phone]  Fax [phone]

## 2017-08-29 ENCOUNTER — Ambulatory Visit: Payer: BLUE CROSS/BLUE SHIELD | Admitting: Family Medicine

## 2017-09-01 ENCOUNTER — Telehealth: Payer: Self-pay | Admitting: Family Medicine

## 2017-09-01 MED ORDER — OMEPRAZOLE 40 MG PO CPDR: 40.0000 mg | DELAYED_RELEASE_CAPSULE | Freq: Every day | ORAL | 1 refills | Status: DC | PRN

## 2017-09-01 NOTE — Telephone Encounter (Signed)
Copied from CRM [phone]. Topic: Quick Communication - Rx Refill/Question >> Sep 01, 2017 12:04 PM Alexander BergeronBarksdale, Harvey B wrote: Medication: omeprazole (PRILOSEC) 40 MG capsule [[id number]][[id number]]  Has the patient contacted their pharmacy? Yes.   (Agent: If no, request that the patient contact the pharmacy for the refill.) Preferred Pharmacy (with phone number [facility] street name): [facility] Agent: Please be advised that RX refills may take up to 3 business days. We ask that you follow-up with your pharmacy.

## 2017-11-06 ENCOUNTER — Other Ambulatory Visit: Payer: Self-pay | Admitting: Family Medicine

## 2017-12-02 ENCOUNTER — Other Ambulatory Visit: Payer: Self-pay | Admitting: Family Medicine

## 2017-12-03 NOTE — Telephone Encounter (Signed)
Last office visit 07/29/2017.  [facility] states to follow up mood but no time frame was given.  Last refilled 07/29/2017 for #30 with 3 refills.  Ok to refill?

## 2018-05-12 ENCOUNTER — Other Ambulatory Visit: Payer: Self-pay | Admitting: Family Medicine

## 2018-05-13 ENCOUNTER — Other Ambulatory Visit: Payer: Self-pay | Admitting: Family Medicine

## 2018-05-13 NOTE — Telephone Encounter (Signed)
Pt said [facility] told pt omeprazole is not at pharmacy. I spoke with Melissa at [facility] and she is getting omeprazole ready now. Pt can pick up rx after 5 PM. Pt voiced understanding.

## 2018-06-30 ENCOUNTER — Encounter: Payer: Self-pay | Admitting: Family Medicine

## 2018-06-30 ENCOUNTER — Ambulatory Visit: Payer: BLUE CROSS/BLUE SHIELD | Admitting: Family Medicine

## 2018-06-30 VITALS — BP 120/84 | HR 92 | Temp 98.6°F | Ht 71.0 in | Wt 283.8 lb

## 2018-06-30 DIAGNOSIS — S39012A Strain of muscle, fascia and tendon of lower back, initial encounter: Secondary | ICD-10-CM | POA: Insufficient documentation

## 2018-06-30 MED ORDER — CYCLOBENZAPRINE HCL 10 MG PO TABS: 10.0000 mg | ORAL_TABLET | Freq: Every evening | ORAL | 0 refills | Status: DC | PRN

## 2018-06-30 NOTE — Assessment & Plan Note (Signed)
Heat, massage, stretching.. info given  Start NSAID higher dose prn and muscle relaxant.

## 2018-06-30 NOTE — Patient Instructions (Signed)
Heat, massage, start gentle stretching.  Ibuprofen 800 mg 3 times daily.  Can use muscle relaxant at night as needed for muscle spasm.

## 2018-06-30 NOTE — Progress Notes (Signed)
   Subjective:    Patient ID: Erick Aye, male    DOB: [birth year], 38 y.o.   MRN: [id number]  HPI 38 year old male presents with new onset low back pain.  When stood up after bending over to rinse mouth.   Sharp pain in middle of low back. Tightness.   No pain radiating to legs.  No numbness, no weakness.  No fever, no dysuria.   Using heat, massage, ice, motrin 2 tabs at a time. Didn't help much.  No chronic back problems, no past back surgery.  Social History /Family History/Past Medical History reviewed in detail and updated in EMR if needed. Blood pressure 120/84, pulse 92, temperature 98.6 F (37 C), temperature source Oral, height 5\' 11"  (1.803 m), weight 283 lb 12 oz (128.7 kg), SpO2 96 %.  Review of Systems  Constitutional: Negative for fatigue and fever.  HENT: Negative for ear pain.   Eyes: Negative for pain.  Respiratory: Negative for cough and shortness of breath.   Cardiovascular: Negative for chest pain, palpitations and leg swelling.  Gastrointestinal: Negative for abdominal pain.  Genitourinary: Negative for dysuria.  Musculoskeletal: Negative for arthralgias.  Neurological: Negative for syncope, light-headedness and headaches.  Psychiatric/Behavioral: Negative for dysphoric mood.       Objective:   Physical Exam Constitutional:      Appearance: He is well-developed.  HENT:     Head: Normocephalic.     Right Ear: Hearing normal.     Left Ear: Hearing normal.     Nose: Nose normal.  Neck:     Thyroid: No thyroid mass or thyromegaly.     Vascular: No carotid bruit.     Trachea: Trachea normal.  Cardiovascular:     Rate and Rhythm: Normal rate and regular rhythm.     Pulses: Normal pulses.     Heart sounds: Heart sounds not distant. No murmur. No friction rub. No gallop.      Comments: No peripheral edema Pulmonary:     Effort: Pulmonary effort is normal. No respiratory distress.     Breath sounds: Normal breath sounds.  Musculoskeletal:   Lumbar back: He exhibits decreased range of motion and tenderness. He exhibits no bony tenderness.     Comments: Neg faber's neg bilateral SLR  Skin:    General: Skin is warm and dry.     Findings: No rash.  Neurological:     General: No focal deficit present.     Mental Status: He is alert.     Cranial Nerves: Cranial nerves are intact.     Sensory: Sensation is intact. No sensory deficit.     Motor: Motor function is intact.     Deep Tendon Reflexes: Reflexes are normal and symmetric.  Psychiatric:        Speech: Speech normal.        Behavior: Behavior normal.        Thought Content: Thought content normal.           Assessment & Plan:

## 2018-07-31 ENCOUNTER — Ambulatory Visit (INDEPENDENT_AMBULATORY_CARE_PROVIDER_SITE_OTHER): Payer: BLUE CROSS/BLUE SHIELD | Admitting: Family Medicine

## 2018-07-31 ENCOUNTER — Encounter: Payer: Self-pay | Admitting: Family Medicine

## 2018-07-31 VITALS — BP 124/86 | HR 84 | Temp 98.1°F | Ht 71.0 in | Wt 279.5 lb

## 2018-07-31 DIAGNOSIS — Z0000 Encounter for general adult medical examination without abnormal findings: Secondary | ICD-10-CM

## 2018-07-31 DIAGNOSIS — I10 Essential (primary) hypertension: Secondary | ICD-10-CM | POA: Diagnosis not present

## 2018-07-31 DIAGNOSIS — R7303 Prediabetes: Secondary | ICD-10-CM

## 2018-07-31 DIAGNOSIS — E782 Mixed hyperlipidemia: Secondary | ICD-10-CM | POA: Diagnosis not present

## 2018-07-31 DIAGNOSIS — F411 Generalized anxiety disorder: Secondary | ICD-10-CM

## 2018-07-31 DIAGNOSIS — S39012A Strain of muscle, fascia and tendon of lower back, initial encounter: Secondary | ICD-10-CM

## 2018-07-31 DIAGNOSIS — E669 Obesity, unspecified: Secondary | ICD-10-CM

## 2018-07-31 MED ORDER — SERTRALINE HCL 50 MG PO TABS: 50.0000 mg | ORAL_TABLET | Freq: Every day | ORAL | 1 refills | Status: DC

## 2018-07-31 MED ORDER — OMEPRAZOLE 40 MG PO CPDR: 40.0000 mg | DELAYED_RELEASE_CAPSULE | Freq: Every day | ORAL | 1 refills | Status: DC | PRN

## 2018-07-31 MED ORDER — FENOFIBRATE 160 MG PO TABS: 160.0000 mg | ORAL_TABLET | Freq: Every day | ORAL | 2 refills | Status: DC

## 2018-07-31 MED ORDER — ALPRAZOLAM 0.25 MG PO TABS: 0.2500 mg | ORAL_TABLET | Freq: Every evening | ORAL | 0 refills | Status: DC | PRN

## 2018-07-31 NOTE — Progress Notes (Signed)
Subjective:    Patient ID: Albert Merritt, male    DOB: Jul 11, 1980, 38 y.o.   MRN: [id number]  HPI  The patient is here for annual wellness exam and preventative care.    Hypertension:    Good control on no medication. BP Readings from Last 3 Encounters:  07/31/18 124/86  06/30/18 120/84  07/29/17 130/84  Using medication without problems or lightheadedness:  none Chest pain with exertion: none Edema:none Short of breath:none Average home BPs: Other issues:  Elevated Cholesterol: Due for re-eval.  Lab Results  Component Value Date   CHOL 142 07/25/2017   HDL 32.70 (L) 07/25/2017   LDLCALC 73 07/25/2017   LDLDIRECT 81.0 10/22/2016   TRIG 183.0 (H) 07/25/2017   CHOLHDL 4 07/25/2017  Using medications without problems: Muscle aches:  Diet compliance: intermittent fasting Exercise: 2-3 times a week Other complaints:    Wt Readings from Last 3 Encounters:  07/31/18 279 lb 8 oz (126.8 kg)  06/30/18 283 lb 12 oz (128.7 kg)  07/29/17 281 lb 12 oz (127.8 kg)   GAD: well controlled on sertraline,  Able to shut things down better.  PHQ2: 2  Some issues with sleep at night, feels paniky at night.. wife's xanax helps some.  He has had about 10 days in last 3 months where had trouble sleeping. Sleep fine once asleep.   Social History /Family History/Past Medical History reviewed in detail and updated in EMR if needed. Blood pressure 124/86, pulse 84, temperature 98.1 F (36.7 C), temperature source Oral, height 5\' 11"  (1.803 m), weight 279 lb 8 oz (126.8 kg), SpO2 98 %.  Review of Systems  Constitutional: Negative for fatigue and fever.  HENT: Negative for ear pain.   Eyes: Negative for pain.  Respiratory: Negative for cough and shortness of breath.   Cardiovascular: Negative for chest pain, palpitations and leg swelling.  Gastrointestinal: Negative for abdominal pain.  Genitourinary: Negative for dysuria.  Musculoskeletal: Negative for arthralgias.  Neurological:  Negative for syncope, light-headedness and headaches.  Psychiatric/Behavioral: Negative for dysphoric mood.       Objective:   Physical Exam Constitutional:      General: He is not in acute distress.    Appearance: Normal appearance. He is well-developed. He is obese. He is not ill-appearing or toxic-appearing.  HENT:     Head: Normocephalic and atraumatic.     Right Ear: Hearing, tympanic membrane, ear canal and external ear normal.     Left Ear: Hearing, tympanic membrane, ear canal and external ear normal.     Nose: Nose normal.     Mouth/Throat:     Pharynx: Uvula midline.  Eyes:     General: Lids are normal. Lids are everted, no foreign bodies appreciated.     Conjunctiva/sclera: Conjunctivae normal.     Pupils: Pupils are equal, round, and reactive to light.  Neck:     Musculoskeletal: Normal range of motion and neck supple.     Thyroid: No thyroid mass or thyromegaly.     Vascular: No carotid bruit.     Trachea: Trachea and phonation normal.  Cardiovascular:     Rate and Rhythm: Normal rate and regular rhythm.     Pulses: Normal pulses.     Heart sounds: S1 normal and S2 normal. No murmur. No gallop.   Pulmonary:     Breath sounds: Normal breath sounds. No wheezing, rhonchi or rales.  Abdominal:     General: Bowel sounds are normal.     Palpations:  Abdomen is soft.     Tenderness: There is no abdominal tenderness. There is no guarding or rebound.     Hernia: No hernia is present.  Lymphadenopathy:     Cervical: No cervical adenopathy.  Skin:    General: Skin is warm and dry.     Findings: No rash.  Neurological:     Mental Status: He is alert.     Cranial Nerves: No cranial nerve deficit.     Sensory: No sensory deficit.     Gait: Gait normal.     Deep Tendon Reflexes: Reflexes are normal and symmetric.  Psychiatric:        Speech: Speech normal.        Behavior: Behavior normal.        Judgment: Judgment normal.           Assessment & Plan:  The  patient's preventative maintenance and recommended screening tests for an annual wellness exam were reviewed in full today. Brought up to date unless services declined.  Counselled on the importance of diet, exercise, and its role in overall health and mortality. The patient's FH and SH was reviewed, including their home life, tobacco status, and drug and alcohol status.   Vaccines: Refused flu,uptodateTDap. No concern for STD risk. He has quit dipping, no smoking. 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  ETOH beer on weekend 4-5 at a time one day... recommended decrease.

## 2018-07-31 NOTE — Assessment & Plan Note (Signed)
Improved control on sertraline. Will given xanax prescription to use as needed for panic attack/trouble falling asleep. Reviewed issues with benzodiazepine and encouraged limited use.

## 2018-07-31 NOTE — Assessment & Plan Note (Signed)
Encouraged exercise, weight loss, healthy eating habits. ? ?

## 2018-07-31 NOTE — Assessment & Plan Note (Signed)
Due for re-eval. 

## 2018-07-31 NOTE — Patient Instructions (Addendum)
Return for fasting labs to re-eval cholesterol and glucose.  Work on exercise 3-5 days a week.  Work on weight loss with goal BMI <25.

## 2018-07-31 NOTE — Assessment & Plan Note (Signed)
Good control on no med. 

## 2018-07-31 NOTE — Assessment & Plan Note (Signed)
Resolved

## 2018-08-08 ENCOUNTER — Other Ambulatory Visit: Payer: Self-pay | Admitting: Family Medicine

## 2018-08-17 ENCOUNTER — Encounter: Payer: Self-pay | Admitting: *Deleted

## 2018-08-17 ENCOUNTER — Telehealth: Payer: Self-pay | Admitting: Family Medicine

## 2018-08-17 ENCOUNTER — Other Ambulatory Visit: Payer: Self-pay

## 2018-08-17 ENCOUNTER — Other Ambulatory Visit (INDEPENDENT_AMBULATORY_CARE_PROVIDER_SITE_OTHER): Payer: BLUE CROSS/BLUE SHIELD

## 2018-08-17 DIAGNOSIS — R7303 Prediabetes: Secondary | ICD-10-CM

## 2018-08-17 DIAGNOSIS — E782 Mixed hyperlipidemia: Secondary | ICD-10-CM

## 2018-08-17 LAB — COMPREHENSIVE METABOLIC PANEL
ALT: 29 U/L (ref 0–53)
AST: 17 U/L (ref 0–37)
Albumin: 4.4 g/dL (ref 3.5–5.2)
Alkaline Phosphatase: 66 U/L (ref 39–117)
BUN: 29 mg/dL — AB (ref 6–23)
CO2: 26 mEq/L (ref 19–32)
Calcium: 9.1 mg/dL (ref 8.4–10.5)
Chloride: 103 mEq/L (ref 96–112)
Creatinine, Ser: 0.89 mg/dL (ref 0.40–1.50)
GFR: 95.24 mL/min (ref >60.00)
Glucose, Bld: 104 mg/dL — ABNORMAL HIGH (ref 70–99)
Potassium: 4.3 mEq/L (ref 3.5–5.1)
SODIUM: 138 meq/L (ref 135–145)
TOTAL PROTEIN: 6.8 g/dL (ref 6.0–8.3)
Total Bilirubin: 0.4 mg/dL (ref 0.2–1.2)

## 2018-08-17 LAB — LIPID PANEL
Cholesterol: 168 mg/dL (ref 0–200)
HDL: 37.6 mg/dL — ABNORMAL LOW (ref >39.00)
NonHDL: 130.71
Total CHOL/HDL Ratio: 4
Triglycerides: 303 mg/dL — ABNORMAL HIGH (ref 0.0–149.0)
VLDL: 60.6 mg/dL — ABNORMAL HIGH (ref 0.0–40.0)

## 2018-08-17 LAB — HEMOGLOBIN A1C: Hgb A1c MFr Bld: 5.9 % (ref 4.6–6.5)

## 2018-08-17 LAB — LDL CHOLESTEROL, DIRECT: LDL DIRECT: 87 mg/dL

## 2018-08-17 NOTE — Telephone Encounter (Signed)
-----   Message from Alvina Chou sent at 08/10/2018  3:03 PM EDT ----- Regarding: lab orders for Monday, 3.16.20 Lab orders, no f/u appt

## 2018-09-09 ENCOUNTER — Other Ambulatory Visit: Payer: Self-pay | Admitting: Family Medicine

## 2018-12-09 ENCOUNTER — Telehealth: Payer: Self-pay | Admitting: Family Medicine

## 2018-12-09 NOTE — Telephone Encounter (Signed)
Albert Merritt notified that we do not have his blood type in his chart.  I advised the best way to find out his blood type would be to donate blood through the [employer].

## 2018-12-09 NOTE — Telephone Encounter (Signed)
Patient called to see if we had in his file what his blood type is. And if not how he can go about getting this information   C/B # [phone]

## 2018-12-15 ENCOUNTER — Other Ambulatory Visit: Payer: Self-pay | Admitting: Family Medicine

## 2018-12-15 NOTE — Telephone Encounter (Signed)
Please advise pt requesting refill of xanax 0.25 MG last refill 07/31/2018 #20 0 refills prn Last OV 07/31/2018 next OV 08/06/2019

## 2019-02-17 ENCOUNTER — Other Ambulatory Visit: Payer: Self-pay | Admitting: *Deleted

## 2019-02-17 MED ORDER — OMEPRAZOLE 40 MG PO CPDR: 40.0000 mg | DELAYED_RELEASE_CAPSULE | Freq: Every day | ORAL | 1 refills | Status: DC | PRN

## 2019-04-13 ENCOUNTER — Other Ambulatory Visit: Payer: Self-pay

## 2019-04-13 DIAGNOSIS — Z20822 Contact with and (suspected) exposure to covid-19: Secondary | ICD-10-CM

## 2019-04-15 LAB — NOVEL CORONAVIRUS, NAA: SARS-CoV-2, NAA: DETECTED — AB

## 2019-04-20 ENCOUNTER — Encounter: Payer: Self-pay | Admitting: Family Medicine

## 2019-04-20 ENCOUNTER — Ambulatory Visit (INDEPENDENT_AMBULATORY_CARE_PROVIDER_SITE_OTHER): Payer: BC Managed Care – PPO | Admitting: Family Medicine

## 2019-04-20 DIAGNOSIS — L608 Other nail disorders: Secondary | ICD-10-CM

## 2019-04-20 NOTE — Progress Notes (Signed)
VIRTUAL VISIT Due to national recommendations of social distancing due to [place] 19, a virtual visit is felt to be most appropriate for this patient at this time.   I connected with the patient on 04/20/19 at 11:40 AM EST by virtual telehealth platform and verified that I am speaking with the correct person using two identifiers.   I discussed the limitations, risks, security and privacy concerns of performing an evaluation and management service by  virtual telehealth platform and the availability of in person appointments. I also discussed with the patient that there may be a patient responsible charge related to this service. The patient expressed understanding and agreed to proceed.  Patient location: Home Provider Location: [facility] Participants: Eliezer Lofts and Gweneth Fritter   Chief Complaint  Patient presents with  . Nail Problem    Right Big Toe    History of Present Illness:  38 year old male pt presents with new onset lesion on nail of right great toe x 4 months. White, yellow, brown under central nail bed. No  pain. No redness. No heat.  No nail debris.  He does pedicures.  Small  dry flaky skin on feet on side of feet. No rash on feet.  COVID 19 screen No recent travel or known exposure to COVID19 The patient denies respiratory symptoms of COVID 19 at this time.  The importance of social distancing was discussed today.   Review of Systems  Constitutional: Negative for chills and fever.  HENT: Negative for congestion and ear pain.   Eyes: Negative for pain and redness.  Respiratory: Negative for cough and shortness of breath.   Cardiovascular: Negative for chest pain, palpitations and leg swelling.  Gastrointestinal: Negative for abdominal pain, blood in stool, constipation, diarrhea, nausea and vomiting.  Genitourinary: Negative for dysuria.  Musculoskeletal: Negative for falls and myalgias.  Skin: Negative for rash.  Neurological: Negative for  dizziness.  Psychiatric/Behavioral: Negative for depression. The patient is not nervous/anxious.       Past Medical History:  Diagnosis Date  . Hyperlipidemia   . Hypertension     reports that he has never smoked. He quit smokeless tobacco use about 3 years ago.  His smokeless tobacco use included chew. He reports current alcohol use. He reports that he does not use drugs.   Current Outpatient Medications:  .  ALPRAZolam (XANAX) 0.25 MG tablet, TAKE 1 TABLET(0.25 MG) BY MOUTH AT BEDTIME AS NEEDED FOR ANXIETY, Disp: 30 tablet, Rfl: 0 .  cyclobenzaprine (FLEXERIL) 10 MG tablet, Take 1 tablet (10 mg total) by mouth at bedtime as needed for muscle spasms., Disp: 15 tablet, Rfl: 0 .  fenofibrate 160 MG tablet, Take 1 tablet (160 mg total) by mouth daily. HOLD prescription until pt calls., Disp: 90 tablet, Rfl: 2 .  fish oil-omega-3 fatty acids 1000 MG capsule, Take 1 g by mouth 3 (three) times daily.  , Disp: , Rfl:  .  Multiple Vitamin (MULTIVITAMIN) capsule, Take 1 capsule by mouth daily., Disp: , Rfl:  .  omeprazole (PRILOSEC) 40 MG capsule, Take 1 capsule (40 mg total) by mouth daily as needed., Disp: 90 capsule, Rfl: 1 .  Pyridoxine HCl (VITAMIN B-6 PO), Take 1 capsule by mouth., Disp: , Rfl:  .  sertraline (ZOLOFT) 50 MG tablet, Take 1 tablet (50 mg total) by mouth daily. HOLD until  Patient calls., Disp: 90 tablet, Rfl: 1   Observations/Objective: Height 5\' 11"  (1.803 m), weight 275 lb (124.7 kg).  Physical Exam  Physical Exam Constitutional:      General: The patient is not in acute distress. Pulmonary:     Effort: Pulmonary effort is normal. No respiratory distress.  Neurological:     Mental Status: The patient is alert and oriented to person, place, and time.  Psychiatric:        Mood and Affect: Mood normal.        Behavior: Behavior normal.  Right Toenail: white discoloration in central toenail, no subungual debris  Assessment and Plan Toenail deformity Discussed pros  and cons of  Antifungal treatment, sending nail to pathology. Pt plans topical antifungal and will give area time to grow out as it may be simply due to trauma to nail.     I discussed the assessment and treatment plan with the patient. The patient was provided an opportunity to ask questions and all were answered. The patient agreed with the plan and demonstrated an understanding of the instructions.   The patient was advised to call back or seek an in-person evaluation if the symptoms worsen or if the condition fails to improve as anticipated.     Kerby Nora, MD

## 2019-04-26 ENCOUNTER — Encounter: Payer: Self-pay | Admitting: Family Medicine

## 2019-04-26 DIAGNOSIS — L608 Other nail disorders: Secondary | ICD-10-CM | POA: Insufficient documentation

## 2019-04-26 NOTE — Assessment & Plan Note (Signed)
Discussed pros and cons of  Antifungal treatment, sending nail to pathology. Pt plans topical antifungal and will give area time to grow out as it may be simply due to trauma to nail.

## 2019-05-13 ENCOUNTER — Other Ambulatory Visit: Payer: Self-pay | Admitting: Family Medicine

## 2019-07-20 ENCOUNTER — Other Ambulatory Visit: Payer: Self-pay | Admitting: *Deleted

## 2019-07-20 MED ORDER — FENOFIBRATE 160 MG PO TABS: 160.0000 mg | ORAL_TABLET | Freq: Every day | ORAL | 0 refills | Status: DC

## 2019-08-02 ENCOUNTER — Telehealth: Payer: Self-pay | Admitting: Family Medicine

## 2019-08-02 DIAGNOSIS — R7303 Prediabetes: Secondary | ICD-10-CM

## 2019-08-02 DIAGNOSIS — E782 Mixed hyperlipidemia: Secondary | ICD-10-CM

## 2019-08-02 NOTE — Telephone Encounter (Signed)
-----   Message from Alvina Chou sent at 07/26/2019  3:06 PM EST ----- Regarding: Lab orders for Wednesday, 3.3.21 Patient is scheduled for CPX labs, please order future labs, Thanks , Camelia Eng

## 2019-08-04 ENCOUNTER — Other Ambulatory Visit: Payer: Self-pay

## 2019-08-04 ENCOUNTER — Other Ambulatory Visit (INDEPENDENT_AMBULATORY_CARE_PROVIDER_SITE_OTHER): Payer: BC Managed Care – PPO

## 2019-08-04 DIAGNOSIS — R7303 Prediabetes: Secondary | ICD-10-CM

## 2019-08-04 DIAGNOSIS — E782 Mixed hyperlipidemia: Secondary | ICD-10-CM

## 2019-08-04 LAB — COMPREHENSIVE METABOLIC PANEL
ALT: 28 U/L (ref 0–53)
AST: 16 U/L (ref 0–37)
Albumin: 4.5 g/dL (ref 3.5–5.2)
Alkaline Phosphatase: 71 U/L (ref 39–117)
BUN: 19 mg/dL (ref 6–23)
CO2: 24 mEq/L (ref 19–32)
Calcium: 9.6 mg/dL (ref 8.4–10.5)
Chloride: 105 mEq/L (ref 96–112)
Creatinine, Ser: 0.9 mg/dL (ref 0.40–1.50)
GFR: 93.56 mL/min (ref >60.00)
Glucose, Bld: 106 mg/dL — ABNORMAL HIGH (ref 70–99)
Potassium: 4.5 mEq/L (ref 3.5–5.1)
Sodium: 140 mEq/L (ref 135–145)
Total Bilirubin: 0.4 mg/dL (ref 0.2–1.2)
Total Protein: 7.3 g/dL (ref 6.0–8.3)

## 2019-08-04 LAB — LIPID PANEL
Cholesterol: 153 mg/dL (ref 0–200)
HDL: 28.9 mg/dL — ABNORMAL LOW (ref >39.00)
NonHDL: 124.58
Total CHOL/HDL Ratio: 5
Triglycerides: 289 mg/dL — ABNORMAL HIGH (ref 0.0–149.0)
VLDL: 57.8 mg/dL — ABNORMAL HIGH (ref 0.0–40.0)

## 2019-08-04 LAB — HEMOGLOBIN A1C: Hgb A1c MFr Bld: 5.7 % (ref 4.6–6.5)

## 2019-08-04 LAB — LDL CHOLESTEROL, DIRECT: Direct LDL: 72 mg/dL

## 2019-08-04 NOTE — Progress Notes (Signed)
No critical labs need to be addressed urgently. We will discuss labs in detail at upcoming office visit.   

## 2019-08-06 ENCOUNTER — Encounter: Payer: BLUE CROSS/BLUE SHIELD | Admitting: Family Medicine

## 2019-08-13 ENCOUNTER — Encounter: Payer: Self-pay | Admitting: Family Medicine

## 2019-08-13 ENCOUNTER — Ambulatory Visit (INDEPENDENT_AMBULATORY_CARE_PROVIDER_SITE_OTHER): Payer: BC Managed Care – PPO | Admitting: Family Medicine

## 2019-08-13 ENCOUNTER — Other Ambulatory Visit: Payer: Self-pay

## 2019-08-13 VITALS — BP 150/80 | HR 100 | Temp 98.7°F | Ht 71.0 in | Wt 271.5 lb

## 2019-08-13 DIAGNOSIS — E669 Obesity, unspecified: Secondary | ICD-10-CM

## 2019-08-13 DIAGNOSIS — I10 Essential (primary) hypertension: Secondary | ICD-10-CM

## 2019-08-13 DIAGNOSIS — F411 Generalized anxiety disorder: Secondary | ICD-10-CM

## 2019-08-13 DIAGNOSIS — R7303 Prediabetes: Secondary | ICD-10-CM | POA: Diagnosis not present

## 2019-08-13 DIAGNOSIS — E782 Mixed hyperlipidemia: Secondary | ICD-10-CM

## 2019-08-13 DIAGNOSIS — Z0000 Encounter for general adult medical examination without abnormal findings: Secondary | ICD-10-CM

## 2019-08-13 MED ORDER — ALPRAZOLAM 0.25 MG PO TABS: ORAL_TABLET | ORAL | 0 refills | Status: DC

## 2019-08-13 MED ORDER — OMEPRAZOLE 40 MG PO CPDR: 40.0000 mg | DELAYED_RELEASE_CAPSULE | Freq: Every day | ORAL | 3 refills | Status: DC | PRN

## 2019-08-13 NOTE — Assessment & Plan Note (Signed)
Encouraged exercise, weight loss, healthy eating habits. ? ?

## 2019-08-13 NOTE — Patient Instructions (Signed)
Check BP at home.Marland Kitchen goal < 140/90.

## 2019-08-13 NOTE — Progress Notes (Signed)
Chief Complaint  Patient presents with  . Annual Exam    History of Present Illness: HPI   The patient is here for annual wellness exam and preventative care.     Hypertension:    Not at goal  Off medication.. had been better with weight loss. BP Readings from Last 3 Encounters:  08/13/19 (!) 150/80  07/31/18 124/86  06/30/18 120/84   Using medication without problems or lightheadedness: none Chest pain with exertion:none Edema: stable right ankle swelling Short of breath:none Average home BPs: not checking. Other issues:  GAD: Stable control on sertraline.  Using alprazolam prn.. using 30 in last 9 months.  PHQ 2: 0  Elevated Cholesterol:  LDL at goal trig high but tolerable on fenofibrate, omega three Lab Results  Component Value Date   CHOL 153 08/04/2019   HDL 28.90 (L) 08/04/2019   LDLCALC 73 07/25/2017   LDLDIRECT 72.0 08/04/2019   TRIG 289.0 (H) 08/04/2019   CHOLHDL 5 08/04/2019  Using medications without problems: Muscle aches:  Diet compliance: eating healthier Exercise: walking some. Plans to increase. Other complaints:  Prediabetes  Lab Results  Component Value Date   HGBA1C 5.7 08/04/2019     Obesity Body mass index is 37.87 kg/m. Wt Readings from Last 3 Encounters:  08/13/19 271 lb 8 oz (123.2 kg)  04/20/19 275 lb (124.7 kg)  07/31/18 279 lb 8 oz (126.8 kg)      This visit occurred during the SARS-CoV-2 public health emergency.  Safety protocols were in place, including screening questions prior to the visit, additional usage of staff PPE, and extensive cleaning of exam room while observing appropriate contact time as indicated for disinfecting solutions.   COVID 19 screen:  No recent travel or known exposure to COVID19 The patient denies respiratory symptoms of COVID 19 at this time. The importance of social distancing was discussed today.     Review of Systems  Constitutional: Negative for chills and fever.  HENT: Negative for  congestion and ear pain.   Eyes: Negative for pain and redness.  Respiratory: Negative for cough and shortness of breath.   Cardiovascular: Negative for chest pain, palpitations and leg swelling.  Gastrointestinal: Negative for abdominal pain, blood in stool, constipation, diarrhea, nausea and vomiting.  Genitourinary: Negative for dysuria.  Musculoskeletal: Negative for falls and myalgias.  Skin: Negative for rash.  Neurological: Negative for dizziness.  Psychiatric/Behavioral: Negative for depression. The patient is not nervous/anxious.       Past Medical History:  Diagnosis Date  . Hyperlipidemia   . Hypertension     reports that he has never smoked. He quit smokeless tobacco use about 3 years ago.  His smokeless tobacco use included chew. He reports current alcohol use. He reports that he does not use drugs.   Current Outpatient Medications:  .  ALPRAZolam (XANAX) 0.25 MG tablet, TAKE 1 TABLET(0.25 MG) BY MOUTH AT BEDTIME AS NEEDED FOR ANXIETY, Disp: 30 tablet, Rfl: 0 .  Ascorbic Acid (VITAMIN C) 1000 MG tablet, Take 1,000 mg by mouth in the morning and at bedtime., Disp: , Rfl:  .  Cholecalciferol (VITAMIN D-3) 125 MCG (5000 UT) TABS, Take 1 tablet by mouth daily., Disp: , Rfl:  .  fenofibrate 160 MG tablet, Take 1 tablet (160 mg total) by mouth daily. HOLD prescription until pt calls., Disp: 90 tablet, Rfl: 0 .  omeprazole (PRILOSEC) 40 MG capsule, Take 1 capsule (40 mg total) by mouth daily as needed., Disp: 90 capsule, Rfl: 1 .  Pyridoxine HCl (VITAMIN B-6 PO), Take 1 capsule by mouth., Disp: , Rfl:  .  sertraline (ZOLOFT) 50 MG tablet, TAKE 1 TABLET BY MOUTH ONCE DAILY., Disp: 90 tablet, Rfl: 0 .  fish oil-omega-3 fatty acids 1000 MG capsule, Take 1 g by mouth 3 (three) times daily.  , Disp: , Rfl:    Observations/Objective: Blood pressure (!) 150/80, pulse 100, temperature 98.7 F (37.1 C), temperature source Temporal, height 5\' 11"  (1.803 m), weight 271 lb 8 oz (123.2 kg),  SpO2 96 %.  Physical Exam Constitutional:      General: He is not in acute distress.    Appearance: Normal appearance. He is well-developed. He is obese. He is not ill-appearing or toxic-appearing.  HENT:     Head: Normocephalic and atraumatic.     Right Ear: Hearing, tympanic membrane, ear canal and external ear normal.     Left Ear: Hearing, tympanic membrane, ear canal and external ear normal.     Nose: Nose normal.     Mouth/Throat:     Pharynx: Uvula midline.  Eyes:     General: Lids are normal. Lids are everted, no foreign bodies appreciated.     Conjunctiva/sclera: Conjunctivae normal.     Pupils: Pupils are equal, round, and reactive to light.  Neck:     Thyroid: No thyroid mass or thyromegaly.     Vascular: No carotid bruit.     Trachea: Trachea and phonation normal.  Cardiovascular:     Rate and Rhythm: Normal rate and regular rhythm.     Pulses: Normal pulses.     Heart sounds: S1 normal and S2 normal. No murmur. No gallop.   Pulmonary:     Breath sounds: Normal breath sounds. No wheezing, rhonchi or rales.  Abdominal:     General: Bowel sounds are normal.     Palpations: Abdomen is soft.     Tenderness: There is no abdominal tenderness. There is no guarding or rebound.     Hernia: No hernia is present.  Musculoskeletal:     Cervical back: Normal range of motion and neck supple.  Lymphadenopathy:     Cervical: No cervical adenopathy.  Skin:    General: Skin is warm and dry.     Findings: No rash.  Neurological:     Mental Status: He is alert.     Cranial Nerves: No cranial nerve deficit.     Sensory: No sensory deficit.     Gait: Gait normal.     Deep Tendon Reflexes: Reflexes are normal and symmetric.  Psychiatric:        Speech: Speech normal.        Behavior: Behavior normal.        Judgment: Judgment normal.      Assessment and Plan   The patient's preventative maintenance and recommended screening tests for an annual wellness exam were reviewed in  full today. Brought up to date unless services declined.  Counselled on the importance of diet, exercise, and its role in overall health and mortality. The patient's FH and SH was reviewed, including their home life, tobacco status, and drug and alcohol status.   Vaccines: UptodateTDap. No concern for STD risk. He has quit dipping, no smoking. No family history ofearlycolon Brother with hodgkin's lymphoma.  ETOH beer on weekend 4-5 at a time one day... continue to work on decreasing.  Prediabetes Stable control  Obesity (BMI 35.0-39.9 without comorbidity) Encouraged exercise, weight loss, healthy eating habits.   Hyperlipidemia  LDL at goal trig high but tolerable on fenofibrate and fish oil.  GAD (generalized anxiety disorder) Stable control on sertrlaine 50 mg daily and alprazolam prn.  Benign essential hypertension  Borderline high today.. follow at home.    Eliezer Lofts, MD

## 2019-08-13 NOTE — Assessment & Plan Note (Signed)
Borderline high today.. follow at home.

## 2019-08-13 NOTE — Assessment & Plan Note (Signed)
Stable control on sertrlaine 50 mg daily and alprazolam prn.

## 2019-08-13 NOTE — Assessment & Plan Note (Addendum)
LDL at goal trig high but tolerable on fenofibrate and fish oil.

## 2019-08-13 NOTE — Assessment & Plan Note (Signed)
Stable control. 

## 2019-09-28 ENCOUNTER — Other Ambulatory Visit: Payer: Self-pay | Admitting: Family Medicine

## 2019-10-28 ENCOUNTER — Emergency Department: Payer: BC Managed Care – PPO

## 2019-10-28 ENCOUNTER — Emergency Department
Admission: EM | Admit: 2019-10-28 | Discharge: 2019-10-28 | Disposition: A | Discharge status: Home / Self Care | Payer: BC Managed Care – PPO | Attending: Emergency Medicine | Admitting: Emergency Medicine

## 2019-10-28 ENCOUNTER — Other Ambulatory Visit: Payer: Self-pay

## 2019-10-28 ENCOUNTER — Encounter: Payer: Self-pay | Admitting: Emergency Medicine

## 2019-10-28 DIAGNOSIS — R109 Unspecified abdominal pain: Secondary | ICD-10-CM | POA: Diagnosis not present

## 2019-10-28 DIAGNOSIS — N200 Calculus of kidney: Secondary | ICD-10-CM | POA: Diagnosis not present

## 2019-10-28 DIAGNOSIS — K760 Fatty (change of) liver, not elsewhere classified: Secondary | ICD-10-CM | POA: Diagnosis not present

## 2019-10-28 DIAGNOSIS — Z79899 Other long term (current) drug therapy: Secondary | ICD-10-CM | POA: Insufficient documentation

## 2019-10-28 DIAGNOSIS — I10 Essential (primary) hypertension: Secondary | ICD-10-CM | POA: Insufficient documentation

## 2019-10-28 DIAGNOSIS — Z87891 Personal history of nicotine dependence: Secondary | ICD-10-CM | POA: Diagnosis not present

## 2019-10-28 DIAGNOSIS — K429 Umbilical hernia without obstruction or gangrene: Secondary | ICD-10-CM | POA: Diagnosis not present

## 2019-10-28 DIAGNOSIS — N1330 Unspecified hydronephrosis: Secondary | ICD-10-CM | POA: Diagnosis not present

## 2019-10-28 LAB — URINALYSIS, COMPLETE (UACMP) WITH MICROSCOPIC
Bilirubin Urine: NEGATIVE
Glucose, UA: NEGATIVE mg/dL
Ketones, ur: NEGATIVE mg/dL
Leukocytes,Ua: NEGATIVE
Nitrite: NEGATIVE
Protein, ur: 30 mg/dL — AB
RBC / HPF: >50 RBC/hpf — ABNORMAL HIGH (ref 0–5)
Specific Gravity, Urine: 1.024 (ref 1.005–1.030)
pH: 5 (ref 5.0–8.0)

## 2019-10-28 MED ORDER — TAMSULOSIN HCL 0.4 MG PO CAPS: 0.4000 mg | ORAL_CAPSULE | Freq: Every day | ORAL | 0 refills | Status: AC

## 2019-10-28 NOTE — ED Provider Notes (Signed)
[facility] Emergency Department Provider Note ____________________________________________  Time seen: 1549  I have reviewed the triage vital signs and the nursing notes.  HISTORY  Chief Complaint  Flank Pain  HPI Kamilo Och is a 39 y.o. male presents to the ED for evaluation of sudden onset of left flank pain.  Patient describes onset about 11:00 this morning.  He has also had mild nausea with the pain, but denies any vomiting.  He denies any referred pain into the abdomen, urinary retention, gross hematuria.  He denies any history of kidney stones, but does endorse a family history of stones.  He denies any interim fever, chills, or sweats.  He describes now his pain is decreased, and almost resolved.  Past Medical History:  Diagnosis Date  . Hyperlipidemia   . Hypertension     Patient Active Problem List   Diagnosis Date Noted  . Toenail deformity 04/26/2019  . Low back strain, initial encounter 06/30/2018  . GAD (generalized anxiety disorder) 07/29/2017  . Prediabetes 07/29/2017  . Obesity (BMI 35.0-39.9 without comorbidity) 07/29/2017  . Former smokeless tobacco use 07/25/2016  . GERD (gastroesophageal reflux disease) 04/01/2011  . Hyperlipidemia 06/02/2007  . Benign essential hypertension 06/02/2007    Past Surgical History:  Procedure Laterality Date  . TONSILLECTOMY      Prior to Admission medications   Medication Sig Start Date End Date Taking? Authorizing Provider  ALPRAZolam (XANAX) 0.25 MG tablet TAKE 1 TABLET(0.25 MG) BY MOUTH AT BEDTIME AS NEEDED FOR ANXIETY 08/13/19   Bedsole, Amy E, MD  Ascorbic Acid (VITAMIN C) 1000 MG tablet Take 1,000 mg by mouth in the morning and at bedtime.    [provider]  Cholecalciferol (VITAMIN D-3) 125 MCG (5000 UT) TABS Take 1 tablet by mouth daily.    [provider]  fenofibrate 160 MG tablet Take 1 tablet (160 mg total) by mouth daily. HOLD prescription until pt calls. 07/20/19    Bedsole, Amy E, MD  fish oil-omega-3 fatty acids 1000 MG capsule Take 1 g by mouth 3 (three) times daily.      [provider]  omeprazole (PRILOSEC) 40 MG capsule Take 1 capsule (40 mg total) by mouth daily as needed. 08/13/19   Bedsole, Amy E, MD  Pyridoxine HCl (VITAMIN B-6 PO) Take 1 capsule by mouth.    [provider]  sertraline (ZOLOFT) 50 MG tablet TAKE 1 TABLET BY MOUTH EVERY DAY 09/28/19   Bedsole, Amy E, MD  tamsulosin (FLOMAX) 0.4 MG CAPS capsule Take 1 capsule (0.4 mg total) by mouth daily after supper for 7 days. 10/28/19 11/04/19  Keonna Raether, Charlesetta Ivory, PA-C    Allergies Patient has no known allergies.  Family History  Problem Relation Age of Onset  . Hypertension Father   . Hyperlipidemia Father   . Coronary artery disease Father   . Hypertension Brother   . Dementia Maternal Grandmother   . Diabetes Maternal Grandfather   . Hypertension Paternal Grandfather   . Coronary artery disease Paternal Grandfather     Social History Social History   Tobacco Use  . Smoking status: Never Smoker  . Smokeless tobacco: Former Neurosurgeon    Types: Chew  Substance Use Topics  . Alcohol use: Yes    Alcohol/week: 0.0 standard drinks    Comment: occ  . Drug use: No    Review of Systems  Constitutional: Negative for fever. Cardiovascular: Negative for chest pain. Respiratory: Negative for shortness of breath. Gastrointestinal: Negative for  abdominal pain, vomiting and diarrhea. Genitourinary: Negative for dysuria. Reports left flank pain.  Musculoskeletal: Negative for back pain. Skin: Negative for rash. Neurological: Negative for headaches, focal weakness or numbness. ____________________________________________  PHYSICAL EXAM:  VITAL SIGNS: ED Triage Vitals [10/28/19 1525]  Enc Vitals Group     BP (!) 151/89     Pulse Rate 76     Resp 18     Temp 97.6 F (36.4 C)     Temp Source Oral     SpO2 97 %     Weight      Height      Head Circumference       Peak Flow      Pain Score 0     Pain Loc      Pain Edu?      Excl. in GC?     Constitutional: Alert and oriented. Well appearing and in no distress. Head: Normocephalic and atraumatic. Eyes: Conjunctivae are normal. Normal extraocular movements Cardiovascular: Normal rate, regular rhythm. Normal distal pulses. Respiratory: Normal respiratory effort. No wheezes/rales/rhonchi. Gastrointestinal: Soft and nontender. No distention, rebound, guarding, or rigidity.  No significant left flank tenderness is elicited. GU: Exam deferred. Patient notified me of gross sediment in the toilet after collecting a urine sample.  I visualized 2 small, aproximately 3 mm foreign bodies concerning for renal stones.   Musculoskeletal: Nontender with normal range of motion in all extremities.  Neurologic:  Normal gait without ataxia. Normal speech and language. No gross focal neurologic deficits are appreciated. Skin:  Skin is warm, dry and intact. No rash noted. Psychiatric: Mood and affect are normal. Patient exhibits appropriate insight and judgment. ____________________________________________   LABS (pertinent positives/negatives)  Labs Reviewed  URINALYSIS, COMPLETE (UACMP) WITH MICROSCOPIC - Abnormal; Notable for the following components:      Result Value   Color, Urine YELLOW (*)    APPearance CLOUDY (*)    Hgb urine dipstick LARGE (*)    Protein, ur 30 (*)    RBC / HPF >50 (*)    Bacteria, UA RARE (*)    All other components within normal limits  ____________________________________________   RADIOLOGY  CT Renal Stone Study  IMPRESSION: 1. Slight hydronephrosis on the left. No renal or ureteral calculus evident on the left. Question recent calculus passage to account for this slight left renal hydronephrosis. Early pyelonephritis could present in this manner and is a differential consideration in this circumstance. Note that there is no renal abscess or  perinephric fluid/stranding.  2. Borderline wall thickening of the urinary bladder. Question a degree of cystitis. Advise correlation with urinalysis in this regard.  3. No evident bowel obstruction or bowel wall thickening. No abscess in the abdomen or pelvis. Appendix appears normal.  4.  Hepatic steatosis.  5.  Small umbilical hernia containing only fat. ____________________________________________  PROCEDURES  Procedures ____________________________________________  INITIAL IMPRESSION / ASSESSMENT AND PLAN / ED COURSE  Patient with ED evaluation of acute onset of left flank pain consistent with a likely initial presentation of nephrolithiasis.  Patient actually had passage of two 3 mm stones appear consistent with nephrolithiasis as he collected a urine sample.  I visualized the sediment and concur that he likely has acutely passed 2 stones.  CT scan reveals mild hydronephrosis, consistent with the history, but no active renal system calculi.  Patient reports complete resolution of her symptoms at this time.  He will be discharged to follow-up with urology for ongoing symptoms.  Information on nephrolithiasis  provided, as is a prescription for Flomax for the next week.  Patient will follow up as directed and return to the ED as necessary.  Srinivas Lippman was evaluated in Emergency Department on 10/28/2019 for the symptoms described in the history of present illness. He was evaluated in the context of the global COVID-19 pandemic, which necessitated consideration that the patient might be at risk for infection with the SARS-CoV-2 virus that causes COVID-19. Institutional protocols and algorithms that pertain to the evaluation of patients at risk for COVID-19 are in a state of rapid change based on information released by regulatory bodies including the CDC and federal and state organizations. These policies and algorithms were followed during the patient's care in the  ED. ____________________________________________  FINAL CLINICAL IMPRESSION(S) / ED DIAGNOSES  Final diagnoses:  Kidney stone  Left flank pain      Carmie End, Dannielle Karvonen, PA-C 10/28/19 1814    Nance Pear, MD 10/28/19 1836

## 2019-10-28 NOTE — ED Triage Notes (Signed)
Was triage on down time  Presents with left lower back pain    States pain came on around 11 a,m  Positive nausea

## 2019-10-28 NOTE — Discharge Instructions (Signed)
Your exam, urinalysis, and CT scan are consistent with renal stone disease. You have mild inflammatory changes consistent with a recently passed kidney stone(s). Continue to hydrate and empty the bladder regularly. There do not appear to be any remaining stones in the kidney at this time. Follow-up with Urology as needed.

## 2019-11-05 ENCOUNTER — Other Ambulatory Visit: Payer: Self-pay | Admitting: Family Medicine

## 2020-02-08 ENCOUNTER — Other Ambulatory Visit: Payer: Self-pay | Admitting: Family Medicine

## 2020-08-02 ENCOUNTER — Telehealth: Payer: Self-pay | Admitting: Family Medicine

## 2020-08-02 DIAGNOSIS — R7303 Prediabetes: Secondary | ICD-10-CM

## 2020-08-02 DIAGNOSIS — Z1159 Encounter for screening for other viral diseases: Secondary | ICD-10-CM

## 2020-08-02 DIAGNOSIS — E782 Mixed hyperlipidemia: Secondary | ICD-10-CM

## 2020-08-02 NOTE — Telephone Encounter (Signed)
-----   Message from Alvina Chou sent at 07/31/2020 10:58 AM EST ----- Regarding: Lab orders for Wednesday, 3.16.22 Patient is scheduled for CPX labs, please order future labs, Thanks , Camelia Eng

## 2020-08-16 ENCOUNTER — Other Ambulatory Visit (INDEPENDENT_AMBULATORY_CARE_PROVIDER_SITE_OTHER): Payer: BC Managed Care – PPO

## 2020-08-16 ENCOUNTER — Other Ambulatory Visit: Payer: Self-pay

## 2020-08-16 DIAGNOSIS — R7303 Prediabetes: Secondary | ICD-10-CM

## 2020-08-16 DIAGNOSIS — Z1159 Encounter for screening for other viral diseases: Secondary | ICD-10-CM

## 2020-08-16 DIAGNOSIS — E782 Mixed hyperlipidemia: Secondary | ICD-10-CM

## 2020-08-16 LAB — LIPID PANEL
Cholesterol: 152 mg/dL (ref 0–200)
HDL: 33.3 mg/dL — ABNORMAL LOW (ref >39.00)
NonHDL: 118.49
Total CHOL/HDL Ratio: 5
Triglycerides: 292 mg/dL — ABNORMAL HIGH (ref 0.0–149.0)
VLDL: 58.4 mg/dL — ABNORMAL HIGH (ref 0.0–40.0)

## 2020-08-16 LAB — COMPREHENSIVE METABOLIC PANEL
ALT: 40 U/L (ref 0–53)
AST: 22 U/L (ref 0–37)
Albumin: 4.5 g/dL (ref 3.5–5.2)
Alkaline Phosphatase: 61 U/L (ref 39–117)
BUN: 17 mg/dL (ref 6–23)
CO2: 25 mEq/L (ref 19–32)
Calcium: 9.3 mg/dL (ref 8.4–10.5)
Chloride: 104 mEq/L (ref 96–112)
Creatinine, Ser: 0.81 mg/dL (ref 0.40–1.50)
GFR: 110.04 mL/min (ref >60.00)
Glucose, Bld: 99 mg/dL (ref 70–99)
Potassium: 4.1 mEq/L (ref 3.5–5.1)
Sodium: 139 mEq/L (ref 135–145)
Total Bilirubin: 0.6 mg/dL (ref 0.2–1.2)
Total Protein: 7.6 g/dL (ref 6.0–8.3)

## 2020-08-16 LAB — LDL CHOLESTEROL, DIRECT: Direct LDL: 73 mg/dL

## 2020-08-16 LAB — HEMOGLOBIN A1C: Hgb A1c MFr Bld: 5.6 % (ref 4.6–6.5)

## 2020-08-16 NOTE — Progress Notes (Signed)
No critical labs need to be addressed urgently. We will discuss labs in detail at upcoming office visit.   

## 2020-08-17 LAB — HEPATITIS C ANTIBODY
Hepatitis C Ab: NONREACTIVE
SIGNAL TO CUT-OFF: 0.01 (ref <1.00)

## 2020-08-17 NOTE — Progress Notes (Signed)
No critical labs need to be addressed urgently. We will discuss labs in detail at upcoming office visit.   

## 2020-08-18 ENCOUNTER — Other Ambulatory Visit: Payer: Self-pay

## 2020-08-18 ENCOUNTER — Encounter: Payer: Self-pay | Admitting: Family Medicine

## 2020-08-18 ENCOUNTER — Ambulatory Visit (INDEPENDENT_AMBULATORY_CARE_PROVIDER_SITE_OTHER): Payer: BC Managed Care – PPO | Admitting: Family Medicine

## 2020-08-18 VITALS — BP 150/84 | HR 102 | Temp 98.6°F | Ht 71.0 in | Wt 276.8 lb

## 2020-08-18 DIAGNOSIS — E669 Obesity, unspecified: Secondary | ICD-10-CM | POA: Diagnosis not present

## 2020-08-18 DIAGNOSIS — R7303 Prediabetes: Secondary | ICD-10-CM | POA: Diagnosis not present

## 2020-08-18 DIAGNOSIS — Z0000 Encounter for general adult medical examination without abnormal findings: Secondary | ICD-10-CM

## 2020-08-18 DIAGNOSIS — E782 Mixed hyperlipidemia: Secondary | ICD-10-CM | POA: Diagnosis not present

## 2020-08-18 DIAGNOSIS — I10 Essential (primary) hypertension: Secondary | ICD-10-CM

## 2020-08-18 DIAGNOSIS — K219 Gastro-esophageal reflux disease without esophagitis: Secondary | ICD-10-CM

## 2020-08-18 DIAGNOSIS — F411 Generalized anxiety disorder: Secondary | ICD-10-CM

## 2020-08-18 MED ORDER — PANTOPRAZOLE SODIUM 40 MG PO TBEC: 40.0000 mg | DELAYED_RELEASE_TABLET | Freq: Every day | ORAL | 3 refills | Status: DC

## 2020-08-18 MED ORDER — ALPRAZOLAM 0.25 MG PO TABS: ORAL_TABLET | ORAL | 0 refills | Status: DC

## 2020-08-18 NOTE — Progress Notes (Signed)
Patient ID: Albert Merritt, male    DOB: 02-10-1981, 40 y.o.   MRN: [id number]  This visit was conducted in person.  BP (!) 150/84   Pulse (!) 102   Temp 98.6 F (37 C) (Temporal)   Ht 5\' 11"  (1.803 m)   Wt 276 lb 12 oz (125.5 kg)   SpO2 96%   BMI 38.60 kg/m    CC: Chief Complaint  Patient presents with  . Annual Exam     Subjective:   HPI: Albert Merritt is a 40 y.o. male presenting on 08/18/2020 for Annual Exam  Hypertension:   he has been under stress lately at work    BP Readings from Last 3 Encounters:  08/18/20 (!) 150/84  10/28/19 (!) 151/89  08/13/19 (!) 150/80  Using medication without problems or lightheadedness: neno Chest pain with exertion:none Edema: stable from past injury. Short of breath: none Average home BPs: onto recently. Other issues: Wt Readings from Last 3 Encounters:  08/18/20 276 lb 12 oz (125.5 kg)  08/13/19 271 lb 8 oz (123.2 kg)  04/20/19 275 lb (124.7 kg)    GERD:   Insurance not covering omeprazole 40 daily... cannot wean off.    GAD: Stable control on  sertraline50 mg daily.  Using alprazolam  0.25 mg   2 times month for anxiety at bedtime and sleep.  30 tabs lasted 1 year.  Elevated Cholesterol:  Trig remain high on fenofibrate and fish pil.  LDL at goal. Lab Results  Component Value Date   CHOL 152 08/16/2020   HDL 33.30 (L) 08/16/2020   LDLCALC 73 07/25/2017   LDLDIRECT 73.0 08/16/2020   TRIG 292.0 (H) 08/16/2020   CHOLHDL 5 08/16/2020  Using medications without problems: Muscle aches:  Diet compliance: moderate Exercise: walking some Other complaints: The 10-year ASCVD risk score 08/18/2020 DC Denman George., et al., 2013) is: 1.5%   Values used to calculate the score:     Age: 63 years     Sex: Male     Is Non-Hispanic African American: No     Diabetic: No     Tobacco smoker: No     Systolic Blood Pressure: 150 mmHg     Is BP treated: No     HDL Cholesterol: 33.3 mg/dL     Total Cholesterol: 152 mg/dL  Prediabetes   Lab Results  Component Value Date   HGBA1C 5.6 08/16/2020          Relevant past medical, surgical, family and social history reviewed and updated as indicated. Interim medical history since our last visit reviewed. Allergies and medications reviewed and updated. Outpatient Medications Prior to Visit  Medication Sig Dispense Refill  . ALPRAZolam (XANAX) 0.25 MG tablet TAKE 1 TABLET(0.25 MG) BY MOUTH AT BEDTIME AS NEEDED FOR ANXIETY 30 tablet 0  . Ascorbic Acid (VITAMIN C) 1000 MG tablet Take 1,000 mg by mouth in the morning and at bedtime.    . Cholecalciferol (VITAMIN D-3) 125 MCG (5000 UT) TABS Take 1 tablet by mouth daily.    . fenofibrate 160 MG tablet TAKE 1 TABLET BY MOUTH EVERY DAY 90 tablet 3  . fish oil-omega-3 fatty acids 1000 MG capsule Take 1 g by mouth 3 (three) times daily.    08/18/2020 omeprazole (PRILOSEC) 40 MG capsule Take 1 capsule (40 mg total) by mouth daily as needed. 90 capsule 3  . Pyridoxine HCl (VITAMIN B-6 PO) Take 1 capsule by mouth.    . sertraline (ZOLOFT) 50 MG tablet  TAKE 1 TABLET BY MOUTH EVERY DAY 90 tablet 1   No facility-administered medications prior to visit.     Per HPI unless specifically indicated in ROS section below Review of Systems  Constitutional: Negative for fatigue and fever.  HENT: Negative for ear pain.   Eyes: Negative for pain.  Respiratory: Negative for cough and shortness of breath.   Cardiovascular: Negative for chest pain, palpitations and leg swelling.  Gastrointestinal: Negative for abdominal pain.  Genitourinary: Negative for dysuria.  Musculoskeletal: Negative for arthralgias.  Neurological: Negative for syncope, light-headedness and headaches.  Psychiatric/Behavioral: Negative for dysphoric mood.   Objective:  BP (!) 150/84   Pulse (!) 102   Temp 98.6 F (37 C) (Temporal)   Ht 5\' 11"  (1.803 m)   Wt 276 lb 12 oz (125.5 kg)   SpO2 96%   BMI 38.60 kg/m   Wt Readings from Last 3 Encounters:  08/18/20 276 lb 12 oz  (125.5 kg)  08/13/19 271 lb 8 oz (123.2 kg)  04/20/19 275 lb (124.7 kg)      Physical Exam Constitutional:      General: He is not in acute distress.    Appearance: Normal appearance. He is well-developed. He is obese. He is not ill-appearing or toxic-appearing.  HENT:     Head: Normocephalic and atraumatic.     Right Ear: Hearing, tympanic membrane, ear canal and external ear normal.     Left Ear: Hearing, tympanic membrane, ear canal and external ear normal.     Nose: Nose normal.     Mouth/Throat:     Pharynx: Uvula midline.  Eyes:     General: Lids are normal. Lids are everted, no foreign bodies appreciated.     Conjunctiva/sclera: Conjunctivae normal.     Pupils: Pupils are equal, round, and reactive to light.  Neck:     Thyroid: No thyroid mass or thyromegaly.     Vascular: No carotid bruit.     Trachea: Trachea and phonation normal.  Cardiovascular:     Rate and Rhythm: Normal rate and regular rhythm.     Pulses: Normal pulses.     Heart sounds: S1 normal and S2 normal. No murmur heard. No gallop.   Pulmonary:     Breath sounds: Normal breath sounds. No wheezing, rhonchi or rales.  Abdominal:     General: Bowel sounds are normal.     Palpations: Abdomen is soft.     Tenderness: There is no abdominal tenderness. There is no guarding or rebound.     Hernia: No hernia is present.  Musculoskeletal:     Cervical back: Normal range of motion and neck supple.  Lymphadenopathy:     Cervical: No cervical adenopathy.  Skin:    General: Skin is warm and dry.     Findings: No rash.  Neurological:     Mental Status: He is alert.     Cranial Nerves: No cranial nerve deficit.     Sensory: No sensory deficit.     Gait: Gait normal.     Deep Tendon Reflexes: Reflexes are normal and symmetric.  Psychiatric:        Speech: Speech normal.        Behavior: Behavior normal.        Judgment: Judgment normal.       Results for orders placed or performed in visit on 08/16/20   Hepatitis C antibody  Result Value Ref Range   Hepatitis C Ab NON-REACTIVE NON-REACTI   SIGNAL TO  CUT-OFF 0.01 <1.00  Comprehensive metabolic panel  Result Value Ref Range   Sodium 139 135 - 145 mEq/L   Potassium 4.1 3.5 - 5.1 mEq/L   Chloride 104 96 - 112 mEq/L   CO2 25 19 - 32 mEq/L   Glucose, Bld 99 70 - 99 mg/dL   BUN 17 6 - 23 mg/dL   Creatinine, Ser 4.090.81 0.40 - 1.50 mg/dL   Total Bilirubin 0.6 0.2 - 1.2 mg/dL   Alkaline Phosphatase 61 39 - 117 U/L   AST 22 0 - 37 U/L   ALT 40 0 - 53 U/L   Total Protein 7.6 6.0 - 8.3 g/dL   Albumin 4.5 3.5 - 5.2 g/dL   GFR 811.91110.04 >47.82>60.00 mL/min   Calcium 9.3 8.4 - 10.5 mg/dL  Lipid panel  Result Value Ref Range   Cholesterol 152 0 - 200 mg/dL   Triglycerides 956.2292.0 (H) 0.0 - 149.0 mg/dL   HDL 13.0833.30 (L) >65.78>39.00 mg/dL   VLDL 46.958.4 (H) 0.0 - 62.940.0 mg/dL   Total CHOL/HDL Ratio 5    NonHDL 118.49   Hemoglobin A1c  Result Value Ref Range   Hgb A1c MFr Bld 5.6 4.6 - 6.5 %  LDL cholesterol, direct  Result Value Ref Range   Direct LDL 73.0 mg/dL    This visit occurred during the SARS-CoV-2 public health emergency.  Safety protocols were in place, including screening questions prior to the visit, additional usage of staff PPE, and extensive cleaning of exam room while observing appropriate contact time as indicated for disinfecting solutions.   COVID 19 screen:  No recent travel or known exposure to COVID19 The patient denies respiratory symptoms of COVID 19 at this time. The importance of social distancing was discussed today.   Assessment and Plan The patient's preventative maintenance and recommended screening tests for an annual wellness exam were reviewed in full today. Brought up to date unless services declined.  Counselled on the importance of diet, exercise, and its role in overall health and mortality. The patient's FH and SH was reviewed, including their home life, tobacco status, and drug and alcohol status.   Vaccines:  UptodateTDap. COVID x 3 No concern for STD risk. He has quit dipping, no smoking. No family history ofearlycolon Marine scientistcanceror prostatehistory Brother with hodgkin's lymphoma. ETOH beer on weekend  4-5 at a time one day... continue to work on decreasing.   Problem List Items Addressed This Visit    Benign essential hypertension    Stable, chronic. On no  medication.   Follow Blood pressure at home daily.Marland Kitchen. goal BP < 140/90.  Call in 2 weeks with  Measurements.       GAD (generalized anxiety disorder)    Stable, chronic.  Continue current medication.   Stable control on  sertraline50 mg daily.  Using alprazolam  0.25 mg   2 times month for anxiety at bedtime and sleep.  30 tabs lasted 1 year.      Relevant Medications   ALPRAZolam (XANAX) 0.25 MG tablet   GERD (gastroesophageal reflux disease)     Cannot wean off prilosec.  Will try trial of protonix.         Relevant Medications   pantoprazole (PROTONIX) 40 MG tablet   Hyperlipidemia    Stable, chronic.  Continue current medication.   Trig remain high on fenofibrate and fish pil.  LDL at goal.  Encouraged exercise, weight loss, healthy eating habits.       Obesity (BMI 35.0-39.9 without comorbidity)  Prediabetes    Other Visit Diagnoses    Routine general medical examination at a health care facility    -  Primary     Meds ordered this encounter  Medications  . pantoprazole (PROTONIX) 40 MG tablet    Sig: Take 1 tablet (40 mg total) by mouth daily.    Dispense:  30 tablet    Refill:  3  . ALPRAZolam (XANAX) 0.25 MG tablet    Sig: TAKE 1 TABLET(0.25 MG) BY MOUTH AT BEDTIME AS NEEDED FOR ANXIETY    Dispense:  30 tablet    Refill:  0     Kerby Nora, MD

## 2020-08-18 NOTE — Patient Instructions (Addendum)
Follow Blood pressure at home daily.Marland Kitchen goal BP < 140/90.  Call in 2 weeks with  Measurements.  Increase exercise.Marland Kitchen 3-5 days a week.  Work on low fat, low cholesterol diet. Work on healthy weight loss to improved triglycerides , blood pressure etc.

## 2020-09-10 ENCOUNTER — Other Ambulatory Visit: Payer: Self-pay | Admitting: Family Medicine

## 2020-10-01 NOTE — Assessment & Plan Note (Signed)
Stable, chronic.  Continue current medication.   Trig remain high on fenofibrate and fish pil.  LDL at goal.  Encouraged exercise, weight loss, healthy eating habits.

## 2020-10-01 NOTE — Assessment & Plan Note (Addendum)
Cannot wean off prilosec.  Will try trial of protonix.

## 2020-10-01 NOTE — Assessment & Plan Note (Signed)
Stable, chronic.  Continue current medication.   Stable control on  sertraline50 mg daily.  Using alprazolam  0.25 mg   2 times month for anxiety at bedtime and sleep.  30 tabs lasted 1 year.

## 2020-10-01 NOTE — Assessment & Plan Note (Signed)
Stable, chronic. On no  medication.   Follow Blood pressure at home daily.Marland Kitchen goal BP < 140/90.  Call in 2 weeks with  Measurements.

## 2021-01-02 ENCOUNTER — Other Ambulatory Visit: Payer: Self-pay | Admitting: Family Medicine

## 2021-01-25 ENCOUNTER — Other Ambulatory Visit: Payer: Self-pay | Admitting: Family Medicine

## 2021-01-25 NOTE — Telephone Encounter (Signed)
Last office visit 08/18/2020 for CPE.  Last refilled 08/18/2020 for #30 with no refills.  CPE scheduled 08/17/2021.

## 2021-02-07 DIAGNOSIS — F4320 Adjustment disorder, unspecified: Secondary | ICD-10-CM | POA: Diagnosis not present

## 2021-02-14 DIAGNOSIS — F4320 Adjustment disorder, unspecified: Secondary | ICD-10-CM | POA: Diagnosis not present

## 2021-02-23 DIAGNOSIS — F4320 Adjustment disorder, unspecified: Secondary | ICD-10-CM | POA: Diagnosis not present

## 2021-03-01 DIAGNOSIS — F4320 Adjustment disorder, unspecified: Secondary | ICD-10-CM | POA: Diagnosis not present

## 2021-03-06 DIAGNOSIS — F4320 Adjustment disorder, unspecified: Secondary | ICD-10-CM | POA: Diagnosis not present

## 2021-03-30 DIAGNOSIS — F4320 Adjustment disorder, unspecified: Secondary | ICD-10-CM | POA: Diagnosis not present

## 2021-04-13 DIAGNOSIS — J019 Acute sinusitis, unspecified: Secondary | ICD-10-CM | POA: Diagnosis not present

## 2021-04-13 DIAGNOSIS — B9689 Other specified bacterial agents as the cause of diseases classified elsewhere: Secondary | ICD-10-CM | POA: Diagnosis not present

## 2021-04-13 DIAGNOSIS — Z8616 Personal history of COVID-19: Secondary | ICD-10-CM | POA: Diagnosis not present

## 2021-04-18 DIAGNOSIS — F4320 Adjustment disorder, unspecified: Secondary | ICD-10-CM | POA: Diagnosis not present

## 2021-04-26 ENCOUNTER — Other Ambulatory Visit: Payer: Self-pay | Admitting: Family Medicine

## 2021-05-15 DIAGNOSIS — F4320 Adjustment disorder, unspecified: Secondary | ICD-10-CM | POA: Diagnosis not present

## 2021-06-18 DIAGNOSIS — F4320 Adjustment disorder, unspecified: Secondary | ICD-10-CM | POA: Diagnosis not present

## 2021-07-05 ENCOUNTER — Other Ambulatory Visit: Payer: Self-pay | Admitting: Family Medicine

## 2021-07-11 DIAGNOSIS — F4320 Adjustment disorder, unspecified: Secondary | ICD-10-CM | POA: Diagnosis not present

## 2021-08-08 ENCOUNTER — Telehealth: Payer: Self-pay | Admitting: Family Medicine

## 2021-08-08 DIAGNOSIS — R7303 Prediabetes: Secondary | ICD-10-CM

## 2021-08-08 DIAGNOSIS — E782 Mixed hyperlipidemia: Secondary | ICD-10-CM

## 2021-08-08 NOTE — Telephone Encounter (Signed)
-----   Message from Ellamae Sia sent at 08/01/2021  3:56 PM EST ----- ?Regarding: lab orders for Tuesday, 3.14.23 ?Patient is scheduled for CPX labs, please order future labs, Thanks , Terri ? ? ?

## 2021-08-14 ENCOUNTER — Other Ambulatory Visit: Payer: Self-pay

## 2021-08-14 ENCOUNTER — Other Ambulatory Visit (INDEPENDENT_AMBULATORY_CARE_PROVIDER_SITE_OTHER): Payer: BC Managed Care – PPO

## 2021-08-14 DIAGNOSIS — E782 Mixed hyperlipidemia: Secondary | ICD-10-CM

## 2021-08-14 DIAGNOSIS — R7303 Prediabetes: Secondary | ICD-10-CM

## 2021-08-14 LAB — COMPREHENSIVE METABOLIC PANEL
ALT: 40 U/L (ref 0–53)
AST: 19 U/L (ref 0–37)
Albumin: 4.7 g/dL (ref 3.5–5.2)
Alkaline Phosphatase: 54 U/L (ref 39–117)
BUN: 22 mg/dL (ref 6–23)
CO2: 27 mEq/L (ref 19–32)
Calcium: 9.6 mg/dL (ref 8.4–10.5)
Chloride: 103 mEq/L (ref 96–112)
Creatinine, Ser: 0.88 mg/dL (ref 0.40–1.50)
GFR: 106.57 mL/min (ref >60.00)
Glucose, Bld: 101 mg/dL — ABNORMAL HIGH (ref 70–99)
Potassium: 4.4 mEq/L (ref 3.5–5.1)
Sodium: 139 mEq/L (ref 135–145)
Total Bilirubin: 0.5 mg/dL (ref 0.2–1.2)
Total Protein: 7.3 g/dL (ref 6.0–8.3)

## 2021-08-14 LAB — LIPID PANEL
Cholesterol: 140 mg/dL (ref 0–200)
HDL: 35 mg/dL — ABNORMAL LOW (ref >39.00)
LDL Cholesterol: 66 mg/dL (ref 0–99)
NonHDL: 104.88
Total CHOL/HDL Ratio: 4
Triglycerides: 192 mg/dL — ABNORMAL HIGH (ref 0.0–149.0)
VLDL: 38.4 mg/dL (ref 0.0–40.0)

## 2021-08-14 LAB — HEMOGLOBIN A1C: Hgb A1c MFr Bld: 6 % (ref 4.6–6.5)

## 2021-08-14 NOTE — Progress Notes (Signed)
No critical labs need to be addressed urgently. We will discuss labs in detail at upcoming office visit.   

## 2021-08-17 ENCOUNTER — Encounter: Payer: Self-pay | Admitting: Family Medicine

## 2021-08-17 ENCOUNTER — Other Ambulatory Visit: Payer: Self-pay

## 2021-08-17 ENCOUNTER — Ambulatory Visit (INDEPENDENT_AMBULATORY_CARE_PROVIDER_SITE_OTHER): Payer: BC Managed Care – PPO | Admitting: Family Medicine

## 2021-08-17 ENCOUNTER — Other Ambulatory Visit: Payer: BC Managed Care – PPO

## 2021-08-17 VITALS — BP 136/86 | HR 101 | Ht 71.0 in | Wt 287.0 lb

## 2021-08-17 DIAGNOSIS — I10 Essential (primary) hypertension: Secondary | ICD-10-CM | POA: Diagnosis not present

## 2021-08-17 DIAGNOSIS — F411 Generalized anxiety disorder: Secondary | ICD-10-CM

## 2021-08-17 DIAGNOSIS — Z0000 Encounter for general adult medical examination without abnormal findings: Secondary | ICD-10-CM | POA: Diagnosis not present

## 2021-08-17 DIAGNOSIS — Z23 Encounter for immunization: Secondary | ICD-10-CM | POA: Diagnosis not present

## 2021-08-17 DIAGNOSIS — E782 Mixed hyperlipidemia: Secondary | ICD-10-CM

## 2021-08-17 MED ORDER — ALPRAZOLAM 0.25 MG PO TABS: ORAL_TABLET | ORAL | 0 refills | Status: DC

## 2021-08-17 NOTE — Progress Notes (Signed)
? ? Patient ID: Albert Merritt, male    DOB: 1980-10-02, 41 y.o.   MRN: [id number] ? ?This visit was conducted in person. ? ?BP 136/86   Pulse (!) 101   Ht 5\' 11"  (1.803 m)   Wt 287 lb (130.2 kg)   SpO2 96%   BMI 40.03 kg/m?   ? ?CC:  ?Chief Complaint  ?Patient presents with  ? Annual Exam  ?  Physical , no other concerns  need a refill on xanex   ? ? ?Subjective:  ? ?HPI: ?Albert Merritt is a 41 y.o. male presenting on 08/17/2021 for Annual Exam (Physical , no other concerns  need a refill on xanex ) ? ?Hypertension:    Diet controlled. ?BP Readings from Last 3 Encounters:  ?08/17/21 136/86  ?08/18/20 (!) 150/84  ?10/28/19 (!) 151/89  ?Using medication without problems or lightheadedness:  none ?Chest pain with exertion: none ?Edema: none ?Short of breath: none ?Average home BPs: ?Other issues: ?Stopped ETOH seven months ago. ? ?Elevated Cholesterol:  ?Lab Results  ?Component Value Date  ? CHOL 140 08/14/2021  ? HDL 35.00 (L) 08/14/2021  ? [place] 66 08/14/2021  ? LDLDIRECT 73.0 08/16/2020  ? TRIG 192.0 (H) 08/14/2021  ? CHOLHDL 4 08/14/2021  ?Using medications without problems: ?Muscle aches:  ?Diet compliance: moderate.. recent trip to [place] ?Exercise: joined a gym 2 months ago ?Other complaints: ? ?The 10-year ASCVD risk score (Arnett DK, et al., 2019) is: 1.2% ?  Values used to calculate the score: ?    Age: 85 years ?    Sex: Male ?    Is Non-Hispanic African American: No ?    Diabetic: No ?    Tobacco smoker: No ?    Systolic Blood Pressure: [id number] mmHg ?    Is BP treated: No ?    HDL Cholesterol: 35 mg/dL ?    Total Cholesterol: 140 mg/dL ? ? ? Prediabetes:  ?Lab Results  ?Component Value Date  ? HGBA1C 6.0 08/14/2021  ? ? ? ? GAD:  sertraline  50 mgdaily ? Using xanax  30 tab in a year. Rarely using at night for sleep and anxiety. ?   ? ?Relevant past medical, surgical, family and social history reviewed and updated as indicated. Interim medical history since our last visit reviewed. ?Allergies and  medications reviewed and updated. ?Outpatient Medications Prior to Visit  ?Medication Sig Dispense Refill  ? ALPRAZolam (XANAX) 0.25 MG tablet TAKE 1 TABLET(0.25 MG) BY MOUTH AT BEDTIME AS NEEDED FOR ANXIETY 30 tablet 0  ? Ascorbic Acid (VITAMIN C) 1000 MG tablet Take 1,000 mg by mouth in the morning and at bedtime.    ? Cholecalciferol (VITAMIN D-3) 125 MCG (5000 UT) TABS Take 1 tablet by mouth daily.    ? fenofibrate 160 MG tablet TAKE 1 TABLET BY MOUTH EVERY DAY 90 tablet 3  ? fish oil-omega-3 fatty acids 1000 MG capsule Take 1 g by mouth 3 (three) times daily.    ? pantoprazole (PROTONIX) 40 MG tablet TAKE 1 TABLET(40 MG) BY MOUTH DAILY 30 tablet 3  ? Pyridoxine HCl (VITAMIN B-6 PO) Take 1 capsule by mouth.    ? sertraline (ZOLOFT) 50 MG tablet TAKE 1 TABLET BY MOUTH EVERY DAY 90 tablet 0  ? Zinc Sulfate (ZINC 15 PO) Take by mouth.    ? ?No facility-administered medications prior to visit.  ?  ? ?Per HPI unless specifically indicated in ROS section below ?Review of Systems  ?Constitutional:  Negative for  chills and fever.  ?HENT:  Negative for congestion and ear pain.   ?Eyes:  Negative for pain and redness.  ?Respiratory:  Negative for cough and shortness of breath.   ?Cardiovascular:  Negative for chest pain, palpitations and leg swelling.  ?Gastrointestinal:  Negative for abdominal pain, blood in stool, constipation, diarrhea, nausea and vomiting.  ?Genitourinary:  Negative for dysuria.  ?Musculoskeletal:  Negative for myalgias.  ?Skin:  Negative for rash.  ?Neurological:  Negative for dizziness.  ?Psychiatric/Behavioral:  The patient is not nervous/anxious.   ?Objective:  ?BP 136/86   Pulse (!) 101   Ht 5\' 11"  (1.803 m)   Wt 287 lb (130.2 kg)   SpO2 96%   BMI 40.03 kg/m?   ?Wt Readings from Last 3 Encounters:  ?08/17/21 287 lb (130.2 kg)  ?08/18/20 276 lb 12 oz (125.5 kg)  ?08/13/19 271 lb 8 oz (123.2 kg)  ?  ?  ?Physical Exam ?Constitutional:   ?   General: He is not in acute distress. ?   Appearance:  Normal appearance. He is well-developed. He is not ill-appearing or toxic-appearing.  ?HENT:  ?   Head: Normocephalic and atraumatic.  ?   Right Ear: Hearing, tympanic membrane, ear canal and external ear normal.  ?   Left Ear: Hearing, tympanic membrane, ear canal and external ear normal.  ?   Nose: Nose normal.  ?   Mouth/Throat:  ?   Pharynx: Uvula midline.  ?Eyes:  ?   General: Lids are normal. Lids are everted, no foreign bodies appreciated.  ?   Conjunctiva/sclera: Conjunctivae normal.  ?   Pupils: Pupils are equal, round, and reactive to light.  ?Neck:  ?   Thyroid: No thyroid mass or thyromegaly.  ?   Vascular: No carotid bruit.  ?   Trachea: Trachea and phonation normal.  ?Cardiovascular:  ?   Rate and Rhythm: Normal rate and regular rhythm.  ?   Pulses: Normal pulses.  ?   Heart sounds: S1 normal and S2 normal. Heart sounds not distant. No murmur heard. ?  No friction rub. No gallop.  ?   Comments: No peripheral edema ?Pulmonary:  ?   Effort: Pulmonary effort is normal. No respiratory distress.  ?   Breath sounds: Normal breath sounds. No wheezing, rhonchi or rales.  ?Abdominal:  ?   General: Bowel sounds are normal.  ?   Palpations: Abdomen is soft.  ?   Tenderness: There is no abdominal tenderness. There is no guarding or rebound.  ?   Hernia: No hernia is present.  ?Musculoskeletal:  ?   Cervical back: Normal range of motion and neck supple.  ?Lymphadenopathy:  ?   Cervical: No cervical adenopathy.  ?Skin: ?   General: Skin is warm and dry.  ?   Findings: No rash.  ?Neurological:  ?   Mental Status: He is alert.  ?   Cranial Nerves: No cranial nerve deficit.  ?   Sensory: No sensory deficit.  ?   Gait: Gait normal.  ?   Deep Tendon Reflexes: Reflexes are normal and symmetric.  ?Psychiatric:     ?   Speech: Speech normal.     ?   Behavior: Behavior normal.     ?   Thought Content: Thought content normal.     ?   Judgment: Judgment normal.  ? ?   ?Results for orders placed or performed in visit on  08/14/21  ?Comprehensive metabolic panel  ?Result Value Ref Range  ?  Sodium 139 135 - 145 mEq/L  ? Potassium 4.4 3.5 - 5.1 mEq/L  ? Chloride 103 96 - 112 mEq/L  ? CO2 27 19 - 32 mEq/L  ? Glucose, Bld 101 (H) 70 - 99 mg/dL  ? BUN 22 6 - 23 mg/dL  ? Creatinine, Ser 0.88 0.40 - 1.50 mg/dL  ? Total Bilirubin 0.5 0.2 - 1.2 mg/dL  ? Alkaline Phosphatase 54 39 - 117 U/L  ? AST 19 0 - 37 U/L  ? ALT 40 0 - 53 U/L  ? Total Protein 7.3 6.0 - 8.3 g/dL  ? Albumin 4.7 3.5 - 5.2 g/dL  ? GFR 106.57 >60.00 mL/min  ? Calcium 9.6 8.4 - 10.5 mg/dL  ?Lipid panel  ?Result Value Ref Range  ? Cholesterol 140 0 - 200 mg/dL  ? Triglycerides 192.0 (H) 0.0 - 149.0 mg/dL  ? HDL 35.00 (L) >39.00 mg/dL  ? VLDL 38.4 0.0 - 40.0 mg/dL  ? LDL Cholesterol 66 0 - 99 mg/dL  ? Total CHOL/HDL Ratio 4   ? NonHDL 104.88   ?Hemoglobin A1c  ?Result Value Ref Range  ? Hgb A1c MFr Bld 6.0 4.6 - 6.5 %  ? ? ?This visit occurred during the SARS-CoV-2 public health emergency.  Safety protocols were in place, including screening questions prior to the visit, additional usage of staff PPE, and extensive cleaning of exam room while observing appropriate contact time as indicated for disinfecting solutions.  ? ?COVID 19 screen:  No recent travel or known exposure to [place] ?The patient denies respiratory symptoms of COVID 19 at this time. ?The importance of social distancing was discussed today.  ? ?Assessment and Plan ? ? The patient's preventative maintenance and recommended screening tests for an annual wellness exam were reviewed in full today. ?Brought up to date unless services declined. ? ?Counselled on the importance of diet, exercise, and its role in overall health and mortality. ?The patient's FH and SH was reviewed, including their home life, tobacco status, and drug and alcohol status.  ? ?Tdap: 08/2021 COVID x 3   ?no family history of prostate cancer, colon cancer ? Hep C: done ? ?Problem List Items Addressed This Visit   ? ? Benign essential hypertension   ?  Chronic, diet controlled ? ? ?  ?  ? GAD (generalized anxiety disorder)  ?  Chronic, well controlled ?Continue sertraline  50 mg daily ? Using xanax  30 tab in a year. Rarely using at night for sleep and anxiety. ?

## 2021-09-03 DIAGNOSIS — F4320 Adjustment disorder, unspecified: Secondary | ICD-10-CM | POA: Diagnosis not present

## 2021-09-07 ENCOUNTER — Other Ambulatory Visit: Payer: Self-pay | Admitting: Family Medicine

## 2021-10-03 NOTE — Assessment & Plan Note (Signed)
Chronic, well controlled ?Continue sertraline  50 mg daily ? Using xanax  30 tab in a year. Rarely using at night for sleep and anxiety. ?   ? ?

## 2021-10-03 NOTE — Assessment & Plan Note (Addendum)
Chronic, worsening, BMI now greater than 40 ? ?Counseled in detail on lifestyle changes as well as obesity related comorbidities that he already has an evidence. ?

## 2021-10-03 NOTE — Assessment & Plan Note (Signed)
Chronic, diet controlled. 

## 2021-10-03 NOTE — Assessment & Plan Note (Signed)
Chronic, stable control.  10-year ASCVD risk 1.2%, no medication indicated.Encouraged exercise, weight loss, healthy eating habits. ? ?

## 2021-10-18 ENCOUNTER — Other Ambulatory Visit: Payer: Self-pay | Admitting: Family Medicine

## 2021-11-01 ENCOUNTER — Other Ambulatory Visit: Payer: Self-pay | Admitting: Family Medicine

## 2022-02-21 ENCOUNTER — Other Ambulatory Visit: Payer: Self-pay | Admitting: Family Medicine

## 2022-05-21 ENCOUNTER — Other Ambulatory Visit: Payer: Self-pay | Admitting: Family Medicine

## 2022-06-07 ENCOUNTER — Other Ambulatory Visit: Payer: Self-pay | Admitting: Family Medicine

## 2022-06-26 DIAGNOSIS — M5416 Radiculopathy, lumbar region: Secondary | ICD-10-CM | POA: Diagnosis not present

## 2022-06-26 DIAGNOSIS — M9903 Segmental and somatic dysfunction of lumbar region: Secondary | ICD-10-CM | POA: Diagnosis not present

## 2022-06-27 DIAGNOSIS — M9903 Segmental and somatic dysfunction of lumbar region: Secondary | ICD-10-CM | POA: Diagnosis not present

## 2022-06-27 DIAGNOSIS — M5416 Radiculopathy, lumbar region: Secondary | ICD-10-CM | POA: Diagnosis not present

## 2022-07-01 DIAGNOSIS — M5416 Radiculopathy, lumbar region: Secondary | ICD-10-CM | POA: Diagnosis not present

## 2022-07-01 DIAGNOSIS — M9903 Segmental and somatic dysfunction of lumbar region: Secondary | ICD-10-CM | POA: Diagnosis not present

## 2022-07-09 DIAGNOSIS — M9903 Segmental and somatic dysfunction of lumbar region: Secondary | ICD-10-CM | POA: Diagnosis not present

## 2022-07-09 DIAGNOSIS — M5416 Radiculopathy, lumbar region: Secondary | ICD-10-CM | POA: Diagnosis not present

## 2022-07-15 ENCOUNTER — Other Ambulatory Visit: Payer: Self-pay | Admitting: Family Medicine

## 2022-07-15 NOTE — Telephone Encounter (Signed)
Last office visit 08/17/21 for CPE.  Last refilled 08/17/21 for #30 with no refills.  Next Appt: 08/23/2022 for CPE.

## 2022-08-02 ENCOUNTER — Telehealth: Payer: Self-pay | Admitting: *Deleted

## 2022-08-02 DIAGNOSIS — E782 Mixed hyperlipidemia: Secondary | ICD-10-CM

## 2022-08-02 DIAGNOSIS — R7303 Prediabetes: Secondary | ICD-10-CM

## 2022-08-02 DIAGNOSIS — I10 Essential (primary) hypertension: Secondary | ICD-10-CM

## 2022-08-02 NOTE — Telephone Encounter (Signed)
-----   Message from Velna Hatchet, RT sent at 07/29/2022 11:17 AM EST ----- Regarding: Wed 3/13 lab Patient is scheduled for cpx, please order future labs.  Thanks, Anda Kraft

## 2022-08-08 DIAGNOSIS — Z03818 Encounter for observation for suspected exposure to other biological agents ruled out: Secondary | ICD-10-CM | POA: Diagnosis not present

## 2022-08-08 DIAGNOSIS — B9689 Other specified bacterial agents as the cause of diseases classified elsewhere: Secondary | ICD-10-CM | POA: Diagnosis not present

## 2022-08-08 DIAGNOSIS — J40 Bronchitis, not specified as acute or chronic: Secondary | ICD-10-CM | POA: Diagnosis not present

## 2022-08-08 DIAGNOSIS — J329 Chronic sinusitis, unspecified: Secondary | ICD-10-CM | POA: Diagnosis not present

## 2022-08-14 ENCOUNTER — Other Ambulatory Visit (INDEPENDENT_AMBULATORY_CARE_PROVIDER_SITE_OTHER): Payer: BC Managed Care – PPO

## 2022-08-14 DIAGNOSIS — I10 Essential (primary) hypertension: Secondary | ICD-10-CM

## 2022-08-14 DIAGNOSIS — E782 Mixed hyperlipidemia: Secondary | ICD-10-CM | POA: Diagnosis not present

## 2022-08-14 DIAGNOSIS — R7303 Prediabetes: Secondary | ICD-10-CM

## 2022-08-14 LAB — LIPID PANEL
Cholesterol: 123 mg/dL (ref 0–200)
HDL: 32.2 mg/dL — ABNORMAL LOW (ref >39.00)
LDL Cholesterol: 57 mg/dL (ref 0–99)
NonHDL: 90.76
Total CHOL/HDL Ratio: 4
Triglycerides: 168 mg/dL — ABNORMAL HIGH (ref 0.0–149.0)
VLDL: 33.6 mg/dL (ref 0.0–40.0)

## 2022-08-14 LAB — COMPREHENSIVE METABOLIC PANEL
ALT: 23 U/L (ref 0–53)
AST: 15 U/L (ref 0–37)
Albumin: 4.3 g/dL (ref 3.5–5.2)
Alkaline Phosphatase: 61 U/L (ref 39–117)
BUN: 24 mg/dL — ABNORMAL HIGH (ref 6–23)
CO2: 28 mEq/L (ref 19–32)
Calcium: 9.3 mg/dL (ref 8.4–10.5)
Chloride: 106 mEq/L (ref 96–112)
Creatinine, Ser: 0.86 mg/dL (ref 0.40–1.50)
GFR: 106.56 mL/min (ref >60.00)
Glucose, Bld: 93 mg/dL (ref 70–99)
Potassium: 4.6 mEq/L (ref 3.5–5.1)
Sodium: 141 mEq/L (ref 135–145)
Total Bilirubin: 0.4 mg/dL (ref 0.2–1.2)
Total Protein: 7.1 g/dL (ref 6.0–8.3)

## 2022-08-14 LAB — HEMOGLOBIN A1C: Hgb A1c MFr Bld: 6 % (ref 4.6–6.5)

## 2022-08-14 NOTE — Progress Notes (Signed)
No critical labs need to be addressed urgently. We will discuss labs in detail at upcoming office visit.   

## 2022-08-23 ENCOUNTER — Encounter: Payer: Self-pay | Admitting: Family Medicine

## 2022-08-23 ENCOUNTER — Ambulatory Visit (INDEPENDENT_AMBULATORY_CARE_PROVIDER_SITE_OTHER): Payer: BC Managed Care – PPO | Admitting: Family Medicine

## 2022-08-23 VITALS — BP 120/80 | HR 112 | Temp 97.9°F | Ht 71.25 in | Wt 282.0 lb

## 2022-08-23 DIAGNOSIS — R7303 Prediabetes: Secondary | ICD-10-CM

## 2022-08-23 DIAGNOSIS — I10 Essential (primary) hypertension: Secondary | ICD-10-CM

## 2022-08-23 DIAGNOSIS — Z0000 Encounter for general adult medical examination without abnormal findings: Secondary | ICD-10-CM | POA: Diagnosis not present

## 2022-08-23 DIAGNOSIS — R000 Tachycardia, unspecified: Secondary | ICD-10-CM | POA: Insufficient documentation

## 2022-08-23 DIAGNOSIS — R55 Syncope and collapse: Secondary | ICD-10-CM

## 2022-08-23 DIAGNOSIS — K219 Gastro-esophageal reflux disease without esophagitis: Secondary | ICD-10-CM

## 2022-08-23 NOTE — Patient Instructions (Signed)
Decrease caffeine, alcohol  Avoid decongestants.  Push water.  Call with update of heart rate in 4-6 weeks.Marland Kitchen if persistently elevated or if recurrent syncope.. call for further evaluation with labs for thyroid and anemia.

## 2022-08-23 NOTE — Progress Notes (Signed)
Patient ID: Albert Merritt, male    DOB: 1980/11/26, 42 y.o.   MRN: [id number]  This visit was conducted in person.  BP 120/80   Pulse (!) 112   Temp 97.9 F (36.6 C) (Temporal)   Ht 5' 11.25" (1.81 m)   Wt 282 lb (127.9 kg)   SpO2 97%   BMI 39.06 kg/m    CC:  Chief Complaint  Patient presents with   Annual Exam    Subjective:   HPI: Albert Merritt is a 42 y.o. male presenting on 08/23/2022 for Annual Exam The patient presents for annual medicare wellness, complete physical and review of chronic health problems. He/She also has the following acute concerns today:   Episode of 5 seconds of blacking out, dropped cup, leaned forward then was back.. was bing treated for  sinus infection with prednisone, antibiotics and cough med ( hydrocodone, had not taken since night before)  Had 5-6 beers at that time.  No CP, no seizure like  Hypertension:    Diet controlled. BP Readings from Last 3 Encounters:  08/23/22 120/80  08/17/21 136/86  08/18/20 (!) 150/84  Using medication without problems or lightheadedness:  none Chest pain with exertion: none Edema: none Short of breath: none Average home BPs: Other issues:  ETOH off and on  Wt Readings from Last 3 Encounters:  08/23/22 282 lb (127.9 kg)  08/17/21 287 lb (130.2 kg)  08/18/20 276 lb 12 oz (125.5 kg)     Elevated Cholesterol:  Lab Results  Component Value Date   CHOL 123 08/14/2022   HDL 32.20 (L) 08/14/2022   LDLCALC 57 08/14/2022   LDLDIRECT 73.0 08/16/2020   TRIG 168.0 (H) 08/14/2022   CHOLHDL 4 08/14/2022  Using medications without problems: Muscle aches:  Diet compliance:  moderate, lots of meat Exercise: joined a gym  3 days a week limited with heel Other complaints:    Prediabetes:  Lab Results  Component Value Date   HGBA1C 6.0 08/14/2022      GAD:  sertraline  50 mg daily    Using xanax  30-60 tab in a year. Rarely using at night for sleep and anxiety.     GERD well controlled on  pantoprazole, unable to wean of  Relevant past medical, surgical, family and social history reviewed and updated as indicated. Interim medical history since our last visit reviewed. Allergies and medications reviewed and updated. Outpatient Medications Prior to Visit  Medication Sig Dispense Refill   ALPRAZolam (XANAX) 0.25 MG tablet TAKE 1 TABLET(0.25 MG) BY MOUTH AT BEDTIME AS NEEDED FOR ANXIETY 30 tablet 0   Ascorbic Acid (VITAMIN C) 1000 MG tablet Take 1,000 mg by mouth in the morning and at bedtime.     Cholecalciferol (VITAMIN D-3) 125 MCG (5000 UT) TABS Take 1 tablet by mouth daily.     fenofibrate 160 MG tablet TAKE 1 TABLET BY MOUTH EVERY DAY 90 tablet 3   pantoprazole (PROTONIX) 40 MG tablet TAKE 1 TABLET(40 MG) BY MOUTH DAILY 30 tablet 2   Pyridoxine HCl (VITAMIN B-6 PO) Take 1 capsule by mouth.     sertraline (ZOLOFT) 50 MG tablet TAKE 1 TABLET BY MOUTH EVERY DAY 90 tablet 0   Zinc Sulfate (ZINC 15 PO) Take by mouth.     fish oil-omega-3 fatty acids 1000 MG capsule Take 1 g by mouth 3 (three) times daily.     No facility-administered medications prior to visit.     Per HPI unless specifically indicated  in ROS section below Review of Systems  Constitutional:  Negative for chills and fever.  HENT:  Negative for congestion and ear pain.   Eyes:  Negative for pain and redness.  Respiratory:  Negative for cough and shortness of breath.   Cardiovascular:  Negative for chest pain, palpitations and leg swelling.  Gastrointestinal:  Negative for abdominal pain, blood in stool, constipation, diarrhea, nausea and vomiting.  Genitourinary:  Negative for dysuria.  Musculoskeletal:  Negative for myalgias.  Skin:  Negative for rash.  Neurological:  Negative for dizziness.  Psychiatric/Behavioral:  The patient is not nervous/anxious.    Objective:  BP 120/80   Pulse (!) 112   Temp 97.9 F (36.6 C) (Temporal)   Ht 5' 11.25" (1.81 m)   Wt 282 lb (127.9 kg)   SpO2 97%   BMI 39.06  kg/m   Wt Readings from Last 3 Encounters:  08/23/22 282 lb (127.9 kg)  08/17/21 287 lb (130.2 kg)  08/18/20 276 lb 12 oz (125.5 kg)      Physical Exam Constitutional:      General: He is not in acute distress.    Appearance: Normal appearance. He is well-developed. He is not ill-appearing or toxic-appearing.  HENT:     Head: Normocephalic and atraumatic.     Right Ear: Hearing, tympanic membrane, ear canal and external ear normal.     Left Ear: Hearing, tympanic membrane, ear canal and external ear normal.     Nose: Nose normal.     Mouth/Throat:     Pharynx: Uvula midline.  Eyes:     General: Lids are normal. Lids are everted, no foreign bodies appreciated.     Conjunctiva/sclera: Conjunctivae normal.     Pupils: Pupils are equal, round, and reactive to light.  Neck:     Thyroid: No thyroid mass or thyromegaly.     Vascular: No carotid bruit.     Trachea: Trachea and phonation normal.  Cardiovascular:     Rate and Rhythm: Normal rate and regular rhythm.     Pulses: Normal pulses.     Heart sounds: S1 normal and S2 normal. Heart sounds not distant. No murmur heard.    No friction rub. No gallop.     Comments: No peripheral edema Pulmonary:     Effort: Pulmonary effort is normal. No respiratory distress.     Breath sounds: Normal breath sounds. No wheezing, rhonchi or rales.  Abdominal:     General: Bowel sounds are normal.     Palpations: Abdomen is soft.     Tenderness: There is no abdominal tenderness. There is no guarding or rebound.     Hernia: No hernia is present.  Musculoskeletal:     Cervical back: Normal range of motion and neck supple.  Lymphadenopathy:     Cervical: No cervical adenopathy.  Skin:    General: Skin is warm and dry.     Findings: No rash.  Neurological:     Mental Status: He is alert.     Cranial Nerves: No cranial nerve deficit.     Sensory: No sensory deficit.     Gait: Gait normal.     Deep Tendon Reflexes: Reflexes are normal and  symmetric.  Psychiatric:        Speech: Speech normal.        Behavior: Behavior normal.        Thought Content: Thought content normal.        Judgment: Judgment normal.  Results for orders placed or performed in visit on 08/14/22  Hemoglobin A1c  Result Value Ref Range   Hgb A1c MFr Bld 6.0 4.6 - 6.5 %  Lipid panel  Result Value Ref Range   Cholesterol 123 0 - 200 mg/dL   Triglycerides 168.0 (H) 0.0 - 149.0 mg/dL   HDL 32.20 (L) >39.00 mg/dL   VLDL 33.6 0.0 - 40.0 mg/dL   LDL Cholesterol 57 0 - 99 mg/dL   Total CHOL/HDL Ratio 4    NonHDL 90.76   Comprehensive metabolic panel  Result Value Ref Range   Sodium 141 135 - 145 mEq/L   Potassium 4.6 3.5 - 5.1 mEq/L   Chloride 106 96 - 112 mEq/L   CO2 28 19 - 32 mEq/L   Glucose, Bld 93 70 - 99 mg/dL   BUN 24 (H) 6 - 23 mg/dL   Creatinine, Ser 0.86 0.40 - 1.50 mg/dL   Total Bilirubin 0.4 0.2 - 1.2 mg/dL   Alkaline Phosphatase 61 39 - 117 U/L   AST 15 0 - 37 U/L   ALT 23 0 - 53 U/L   Total Protein 7.1 6.0 - 8.3 g/dL   Albumin 4.3 3.5 - 5.2 g/dL   GFR 106.56 >60.00 mL/min   Calcium 9.3 8.4 - 10.5 mg/dL    This visit occurred during the SARS-CoV-2 public health emergency.  Safety protocols were in place, including screening questions prior to the visit, additional usage of staff PPE, and extensive cleaning of exam room while observing appropriate contact time as indicated for disinfecting solutions.   COVID 19 screen:  No recent travel or known exposure to COVID19 The patient denies respiratory symptoms of COVID 19 at this time. The importance of social distancing was discussed today.   Assessment and Plan   The patient's preventative maintenance and recommended screening tests for an annual wellness exam were reviewed in full today. Brought up to date unless services declined.  Counselled on the importance of diet, exercise, and its role in overall health and mortality. The patient's FH and SH was reviewed, including  their home life, tobacco status, and drug and alcohol status.   Tdap: 08/2021 COVID x 3   no family history of prostate cancer, colon cancer  Hep C: done  No family history colon cancer Problem List Items Addressed This Visit     Benign essential hypertension    Chronic, diet controlled       GERD (gastroesophageal reflux disease)    Stable, chronic.  Continue current medication.  Protonix 40 mg daily         Prediabetes    Stable control Encouraged exercise, weight loss, healthy eating habits.       Routine general medical examination at a health care facility - Primary   Sinus tachycardia     Chronic  Asymptomatic.   Decrease caffeine, alcohol  Avoid decongestants.  Push water.  Call with update of heart rate in 4-6 weeks.Marland Kitchen if persistently elevated or if recurrent syncope.. call for further evaluation with labs for thyroid and anemia.        Syncope    Acute, 5 seconds episode in setting of excess alcohol use and prednisone, antibiotics.  He had not used the hydrocodone cough suppressants since the night before. Most likely secondary to vasovagal syncope and medication side effect.  EKG showed sinus tachycardia, no LVH, no ST changes.  Consider further evaluation as he has recurrent issues.  Encouraged him to decrease alcohol intake.  Relevant Orders   EKG 12-Lead (Completed)   Eliezer Lofts, MD

## 2022-08-23 NOTE — Assessment & Plan Note (Signed)
Stable, chronic.  Continue current medication.  Protonix 40 mg daily

## 2022-08-23 NOTE — Assessment & Plan Note (Signed)
Acute, 5 seconds episode in setting of excess alcohol use and prednisone, antibiotics.  He had not used the hydrocodone cough suppressants since the night before. Most likely secondary to vasovagal syncope and medication side effect.  EKG showed sinus tachycardia, no LVH, no ST changes.  Consider further evaluation as he has recurrent issues.  Encouraged him to decrease alcohol intake.

## 2022-08-23 NOTE — Assessment & Plan Note (Signed)
Stable control. Encouraged exercise, weight loss, healthy eating habits.  

## 2022-08-23 NOTE — Assessment & Plan Note (Signed)
Chronic  Asymptomatic.   Decrease caffeine, alcohol  Avoid decongestants.  Push water.  Call with update of heart rate in 4-6 weeks.Marland Kitchen if persistently elevated or if recurrent syncope.. call for further evaluation with labs for thyroid and anemia.

## 2022-08-23 NOTE — Assessment & Plan Note (Signed)
Chronic, diet controlled 

## 2022-09-03 ENCOUNTER — Other Ambulatory Visit: Payer: Self-pay | Admitting: Family Medicine

## 2022-10-29 ENCOUNTER — Other Ambulatory Visit: Payer: Self-pay | Admitting: Family Medicine

## 2022-11-13 ENCOUNTER — Other Ambulatory Visit: Payer: Self-pay | Admitting: Family Medicine

## 2023-03-21 ENCOUNTER — Other Ambulatory Visit: Payer: Self-pay | Admitting: Family Medicine

## 2023-05-19 ENCOUNTER — Other Ambulatory Visit: Payer: Self-pay | Admitting: Family Medicine

## 2023-05-19 NOTE — Telephone Encounter (Signed)
Last office visit 08/23/2022 for CPE.  Last refilled 07/16/2022 for #30 with no refills.  Next Appt: CPE 08/29/2023.

## 2023-06-09 DIAGNOSIS — M9903 Segmental and somatic dysfunction of lumbar region: Secondary | ICD-10-CM | POA: Diagnosis not present

## 2023-06-09 DIAGNOSIS — M5416 Radiculopathy, lumbar region: Secondary | ICD-10-CM | POA: Diagnosis not present

## 2023-06-10 ENCOUNTER — Telehealth: Payer: BC Managed Care – PPO | Admitting: Nurse Practitioner

## 2023-06-10 DIAGNOSIS — M545 Low back pain, unspecified: Secondary | ICD-10-CM | POA: Diagnosis not present

## 2023-06-10 MED ORDER — CYCLOBENZAPRINE HCL 10 MG PO TABS
10.0000 mg | ORAL_TABLET | Freq: Three times a day (TID) | ORAL | 0 refills | Status: DC | PRN
Start: 2023-06-10 — End: 2023-08-29

## 2023-06-10 NOTE — Progress Notes (Signed)
 Virtual Visit Consent   Albert Merritt, you are scheduled for a virtual visit with a [facility] provider today. Just as with appointments in the office, your consent must be obtained to participate. Your consent will be active for this visit and any virtual visit you may have with one of our providers in the next 365 days. If you have a MyChart account, a copy of this consent can be sent to you electronically.  As this is a virtual visit, video technology does not allow for your provider to perform a traditional examination. This may limit your provider's ability to fully assess your condition. If your provider identifies any concerns that need to be evaluated in person or the need to arrange testing (such as labs, EKG, etc.), we will make arrangements to do so. Although advances in technology are sophisticated, we cannot ensure that it will always work on either your end or our end. If the connection with a video visit is poor, the visit may have to be switched to a telephone visit. With either a video or telephone visit, we are not always able to ensure that we have a secure connection.  By engaging in this virtual visit, you consent to the provision of healthcare and authorize for your insurance to be billed (if applicable) for the services provided during this visit. Depending on your insurance coverage, you may receive a charge related to this service.  I need to obtain your verbal consent now. Are you willing to proceed with your visit today? Albert Merritt has provided verbal consent on 06/10/2023 for a virtual visit (video or telephone). Lauraine Kitty, FNP  Date: 06/10/2023 9:17 AM  Virtual Visit via Video Note   I, Lauraine Kitty, connected with  Albert Merritt  ([id number], 11-02-1980) on 06/10/23 at  9:30 AM EST by a video-enabled telemedicine application and verified that I am speaking with the correct person using two identifiers.  Location: Patient: Virtual Visit Location Patient:  Home Provider: Virtual Visit Location Provider: Home Office   I discussed the limitations of evaluation and management by telemedicine and the availability of in person appointments. The patient expressed understanding and agreed to proceed.    History of Present Illness: Albert Merritt is a 43 y.o. who identifies as a male who was assigned male at birth, and is being seen today for lower back pain.   He was helping his daughter move over the weekend and the following morning he felt some pain in lower back.  He pushed through yesterday and went to the chiropractor for an adjustment after work   He continued to ice his back throughout the night  This morning he feels tight in his lower left back and middle  Denies any pain that radiates to his legs   Denies numbness or weakness into legs   Denies any back surgery in the past  Has injured previously with strains noted back to 2020  Problems:  Patient Active Problem List   Diagnosis Date Noted   Sinus tachycardia 08/23/2022   Routine general medical examination at a health care facility 08/23/2022   Syncope 08/23/2022   Toenail deformity 04/26/2019   Low back strain, initial encounter 06/30/2018   GAD (generalized anxiety disorder) 07/29/2017   Prediabetes 07/29/2017   Morbid obesity (HCC) 07/29/2017   Former smokeless tobacco use 07/25/2016   GERD (gastroesophageal reflux disease) 04/01/2011   Hyperlipidemia 06/02/2007   Benign essential hypertension 06/02/2007    Allergies: No Known Allergies Medications:  Current Outpatient  Medications:    ALPRAZolam  (XANAX ) 0.25 MG tablet, TAKE 1 TABLET(0.25 MG) BY MOUTH AT BEDTIME AS NEEDED FOR ANXIETY, Disp: 30 tablet, Rfl: 0   Ascorbic Acid (VITAMIN C) 1000 MG tablet, Take 1,000 mg by mouth in the morning and at bedtime., Disp: , Rfl:    Cholecalciferol (VITAMIN D-3) 125 MCG (5000 UT) TABS, Take 1 tablet by mouth daily., Disp: , Rfl:    fenofibrate  160 MG tablet, TAKE 1 TABLET BY MOUTH  EVERY DAY, Disp: 90 tablet, Rfl: 3   pantoprazole  (PROTONIX ) 40 MG tablet, TAKE 1 TABLET(40 MG) BY MOUTH DAILY, Disp: 90 tablet, Rfl: 1   Pyridoxine HCl (VITAMIN B-6 PO), Take 1 capsule by mouth., Disp: , Rfl:    sertraline  (ZOLOFT ) 50 MG tablet, TAKE 1 TABLET BY MOUTH EVERY DAY, Disp: 90 tablet, Rfl: 1   Zinc Sulfate (ZINC 15 PO), Take by mouth., Disp: , Rfl:   Observations/Objective: Patient is well-developed, well-nourished in no acute distress.  Resting comfortably  at home.  Head is normocephalic, atraumatic.  No labored breathing.  Speech is clear and coherent with logical content.  Patient is alert and oriented at baseline.    Assessment and Plan:  1. Acute left-sided low back pain without sciatica (Primary)  May alternate tylenol and advil   - cyclobenzaprine  (FLEXERIL ) 10 MG tablet; Take 1 tablet (10 mg total) by mouth 3 (three) times daily as needed for muscle spasms.  Dispense: 30 tablet; Refill: 0     Follow Up Instructions: I discussed the assessment and treatment plan with the patient. The patient was provided an opportunity to ask questions and all were answered. The patient agreed with the plan and demonstrated an understanding of the instructions.  A copy of instructions were sent to the patient via MyChart unless otherwise noted below.    The patient was advised to call back or seek an in-person evaluation if the symptoms worsen or if the condition fails to improve as anticipated.    Lauraine Kitty, FNP

## 2023-06-11 DIAGNOSIS — M5416 Radiculopathy, lumbar region: Secondary | ICD-10-CM | POA: Diagnosis not present

## 2023-06-11 DIAGNOSIS — M9903 Segmental and somatic dysfunction of lumbar region: Secondary | ICD-10-CM | POA: Diagnosis not present

## 2023-06-14 ENCOUNTER — Other Ambulatory Visit: Payer: Self-pay | Admitting: Family Medicine

## 2023-06-16 DIAGNOSIS — M9903 Segmental and somatic dysfunction of lumbar region: Secondary | ICD-10-CM | POA: Diagnosis not present

## 2023-06-16 DIAGNOSIS — M5416 Radiculopathy, lumbar region: Secondary | ICD-10-CM | POA: Diagnosis not present

## 2023-06-23 DIAGNOSIS — M9903 Segmental and somatic dysfunction of lumbar region: Secondary | ICD-10-CM | POA: Diagnosis not present

## 2023-06-23 DIAGNOSIS — M5416 Radiculopathy, lumbar region: Secondary | ICD-10-CM | POA: Diagnosis not present

## 2023-07-09 DIAGNOSIS — H5203 Hypermetropia, bilateral: Secondary | ICD-10-CM | POA: Diagnosis not present

## 2023-07-28 ENCOUNTER — Telehealth: Payer: Self-pay | Admitting: *Deleted

## 2023-07-28 DIAGNOSIS — I10 Essential (primary) hypertension: Secondary | ICD-10-CM

## 2023-07-28 DIAGNOSIS — E782 Mixed hyperlipidemia: Secondary | ICD-10-CM

## 2023-07-28 DIAGNOSIS — R7303 Prediabetes: Secondary | ICD-10-CM

## 2023-07-28 NOTE — Telephone Encounter (Signed)
-----   Message from Lovena Neighbours sent at 07/28/2023  3:00 PM EST ----- Regarding: Labs for Friday 3.21.25 Please put physical lab orders in future. Thank you, Denny Peon

## 2023-08-18 DIAGNOSIS — R051 Acute cough: Secondary | ICD-10-CM | POA: Diagnosis not present

## 2023-08-18 DIAGNOSIS — J40 Bronchitis, not specified as acute or chronic: Secondary | ICD-10-CM | POA: Diagnosis not present

## 2023-08-18 DIAGNOSIS — J101 Influenza due to other identified influenza virus with other respiratory manifestations: Secondary | ICD-10-CM | POA: Diagnosis not present

## 2023-08-18 DIAGNOSIS — Z03818 Encounter for observation for suspected exposure to other biological agents ruled out: Secondary | ICD-10-CM | POA: Diagnosis not present

## 2023-08-22 ENCOUNTER — Other Ambulatory Visit (INDEPENDENT_AMBULATORY_CARE_PROVIDER_SITE_OTHER)

## 2023-08-22 ENCOUNTER — Encounter: Payer: Self-pay | Admitting: Family Medicine

## 2023-08-22 ENCOUNTER — Other Ambulatory Visit: Payer: BC Managed Care – PPO

## 2023-08-22 DIAGNOSIS — E782 Mixed hyperlipidemia: Secondary | ICD-10-CM | POA: Diagnosis not present

## 2023-08-22 DIAGNOSIS — I10 Essential (primary) hypertension: Secondary | ICD-10-CM

## 2023-08-22 DIAGNOSIS — R7303 Prediabetes: Secondary | ICD-10-CM

## 2023-08-22 LAB — LIPID PANEL
Cholesterol: 110 mg/dL (ref 0–200)
HDL: 29.9 mg/dL — ABNORMAL LOW (ref >39.00)
LDL Cholesterol: 58 mg/dL (ref 0–99)
NonHDL: 80.23
Total CHOL/HDL Ratio: 4
Triglycerides: 109 mg/dL (ref 0.0–149.0)
VLDL: 21.8 mg/dL (ref 0.0–40.0)

## 2023-08-22 LAB — COMPREHENSIVE METABOLIC PANEL
ALT: 20 U/L (ref 0–53)
AST: 12 U/L (ref 0–37)
Albumin: 4.9 g/dL (ref 3.5–5.2)
Alkaline Phosphatase: 56 U/L (ref 39–117)
BUN: 22 mg/dL (ref 6–23)
CO2: 27 meq/L (ref 19–32)
Calcium: 9.7 mg/dL (ref 8.4–10.5)
Chloride: 104 meq/L (ref 96–112)
Creatinine, Ser: 0.87 mg/dL (ref 0.40–1.50)
GFR: 105.43 mL/min (ref >60.00)
Glucose, Bld: 80 mg/dL (ref 70–99)
Potassium: 4.2 meq/L (ref 3.5–5.1)
Sodium: 141 meq/L (ref 135–145)
Total Bilirubin: 0.5 mg/dL (ref 0.2–1.2)
Total Protein: 8.2 g/dL (ref 6.0–8.3)

## 2023-08-22 LAB — HEMOGLOBIN A1C: Hgb A1c MFr Bld: 6.2 % (ref 4.6–6.5)

## 2023-08-22 NOTE — Progress Notes (Signed)
 No critical labs need to be addressed urgently. We will discuss labs in detail at upcoming office visit.

## 2023-08-29 ENCOUNTER — Ambulatory Visit (INDEPENDENT_AMBULATORY_CARE_PROVIDER_SITE_OTHER): Payer: BC Managed Care – PPO | Admitting: Family Medicine

## 2023-08-29 ENCOUNTER — Encounter: Payer: Self-pay | Admitting: Family Medicine

## 2023-08-29 VITALS — BP 122/80 | HR 103 | Temp 98.1°F | Ht 71.0 in | Wt 277.1 lb

## 2023-08-29 DIAGNOSIS — E782 Mixed hyperlipidemia: Secondary | ICD-10-CM | POA: Diagnosis not present

## 2023-08-29 DIAGNOSIS — M545 Low back pain, unspecified: Secondary | ICD-10-CM

## 2023-08-29 DIAGNOSIS — Z0000 Encounter for general adult medical examination without abnormal findings: Secondary | ICD-10-CM | POA: Diagnosis not present

## 2023-08-29 DIAGNOSIS — L989 Disorder of the skin and subcutaneous tissue, unspecified: Secondary | ICD-10-CM | POA: Insufficient documentation

## 2023-08-29 DIAGNOSIS — I10 Essential (primary) hypertension: Secondary | ICD-10-CM

## 2023-08-29 DIAGNOSIS — R000 Tachycardia, unspecified: Secondary | ICD-10-CM

## 2023-08-29 DIAGNOSIS — F411 Generalized anxiety disorder: Secondary | ICD-10-CM

## 2023-08-29 DIAGNOSIS — R7303 Prediabetes: Secondary | ICD-10-CM

## 2023-08-29 MED ORDER — CYCLOBENZAPRINE HCL 10 MG PO TABS: 10.0000 mg | ORAL_TABLET | Freq: Three times a day (TID) | ORAL | 0 refills | Status: AC | PRN

## 2023-08-29 MED ORDER — FENOFIBRATE 160 MG PO TABS: 160.0000 mg | ORAL_TABLET | Freq: Every day | ORAL | 3 refills | Status: AC

## 2023-08-29 NOTE — Assessment & Plan Note (Signed)
 Chronic, mproved  Counseled in detail on lifestyle changes as well as obesity related comorbidities that he already has an evidence.

## 2023-08-29 NOTE — Assessment & Plan Note (Signed)
 See photo..  several skin tags, one leison darker and larger.. refer to Derm.

## 2023-08-29 NOTE — Assessment & Plan Note (Signed)
 Chronic, excellent control on fenofibrate.

## 2023-08-29 NOTE — Assessment & Plan Note (Signed)
 Continue tachycardia... pt asymptomatic.. no further syncopal spells. Unremarkable EKG last year.   Will  eval further with next lab draw or if symptomatic... with TSH, cbc.  Consider  Zio monitor.

## 2023-08-29 NOTE — Assessment & Plan Note (Signed)
Chronic, well controlled ?Continue sertraline  50 mg daily ? Using xanax  30 tab in a year. Rarely using at night for sleep and anxiety. ?   ? ?

## 2023-08-29 NOTE — Assessment & Plan Note (Signed)
 Chronic control

## 2023-08-29 NOTE — Progress Notes (Signed)
 Patient ID: Normal Recinos, male    DOB: 1980/12/11, 43 y.o.   MRN: [id number]  This visit was conducted in person.  BP 122/80 (BP Location: Left Arm, Patient Position: Sitting, Cuff Size: Large)   Pulse (!) 103   Temp 98.1 F (36.7 C) (Temporal)   Ht 5\' 11"  (1.803 m)   Wt 277 lb 2 oz (125.7 kg)   SpO2 98%   BMI 38.65 kg/m    CC:  Chief Complaint  Patient presents with   Annual Exam    Subjective:   HPI: Keontay Vora is a 43 y.o. male presenting on 08/29/2023 for Annual Exam The patient presents for annual medicare wellness, complete physical and review of chronic health problems. He/She also has the following acute concerns today: No further syncopal issues.   Recent influenza.. Improved.. still some decreased fatigue.   Mild intermittent   Sinus tach: avoiding caffeine excpet coffee in AM. Asymptomatic. Hypertension:    Diet controlled. BP Readings from Last 3 Encounters:  08/29/23 122/80  08/23/22 120/80  08/17/21 136/86  Using medication without problems or lightheadedness:  none Chest pain with exertion: none Edema: none Short of breath: none Average home BPs: Other issues:  ETOH off and on... less than before  Wt Readings from Last 3 Encounters:  08/29/23 277 lb 2 oz (125.7 kg)  08/23/22 282 lb (127.9 kg)  08/17/21 287 lb (130.2 kg)  Body mass index is 38.65 kg/m.    Elevated Cholesterol:  Lab Results  Component Value Date   CHOL 110 08/22/2023   HDL 29.90 (L) 08/22/2023   LDLCALC 58 08/22/2023   LDLDIRECT 73.0 08/16/2020   TRIG 109.0 08/22/2023   CHOLHDL 4 08/22/2023  Using medications without problems: Muscle aches:  Diet compliance:  Exercise:  3 days a week lifing, walking planned Other complaints:   Prediabetes:  slight worsening Lab Results  Component Value Date   HGBA1C 6.2 08/22/2023      GAD:  sertraline  50 mg daily  Using xanax  30-60 tab in a year. Rarely using at night for sleep and anxiety. Flowsheet Row Office  Visit from 08/29/2023 in [facility]  PHQ-2 Total Score 1      Has skin lesions on left neck.. no changing but more appearing, darker lesion..irritated at time with shaving.     GERD well controlled on pantoprazole, unable to wean of  Relevant past medical, surgical, family and social history reviewed and updated as indicated. Interim medical history since our last visit reviewed. Allergies and medications reviewed and updated. Outpatient Medications Prior to Visit  Medication Sig Dispense Refill   ALPRAZolam (XANAX) 0.25 MG tablet TAKE 1 TABLET(0.25 MG) BY MOUTH AT BEDTIME AS NEEDED FOR ANXIETY 30 tablet 0   Ascorbic Acid (VITAMIN C) 1000 MG tablet Take 1,000 mg by mouth in the morning and at bedtime.     Cholecalciferol (VITAMIN D-3) 125 MCG (5000 UT) TABS Take 1 tablet by mouth daily.     pantoprazole (PROTONIX) 40 MG tablet TAKE 1 TABLET(40 MG) BY MOUTH DAILY 90 tablet 0   Pyridoxine HCl (VITAMIN B-6 PO) Take 1 capsule by mouth.     sertraline (ZOLOFT) 50 MG tablet TAKE 1 TABLET BY MOUTH EVERY DAY 90 tablet 1   cyclobenzaprine (FLEXERIL) 10 MG tablet Take 1 tablet (10 mg total) by mouth 3 (three) times daily as needed for muscle spasms. 30 tablet 0   fenofibrate 160 MG tablet TAKE 1 TABLET  BY MOUTH EVERY DAY 90 tablet 3   Zinc Sulfate (ZINC 15 PO) Take by mouth.     No facility-administered medications prior to visit.     Per HPI unless specifically indicated in ROS section below Review of Systems  Constitutional:  Negative for chills and fever.  HENT:  Negative for congestion and ear pain.   Eyes:  Negative for pain and redness.  Respiratory:  Negative for cough and shortness of breath.   Cardiovascular:  Negative for chest pain, palpitations and leg swelling.  Gastrointestinal:  Negative for abdominal pain, blood in stool, constipation, diarrhea, nausea and vomiting.  Genitourinary:  Negative for dysuria.  Musculoskeletal:  Negative for  myalgias.  Skin:  Negative for rash.  Neurological:  Negative for dizziness.  Psychiatric/Behavioral:  The patient is not nervous/anxious.    Objective:  BP 122/80 (BP Location: Left Arm, Patient Position: Sitting, Cuff Size: Large)   Pulse (!) 103   Temp 98.1 F (36.7 C) (Temporal)   Ht 5\' 11"  (1.803 m)   Wt 277 lb 2 oz (125.7 kg)   SpO2 98%   BMI 38.65 kg/m   Wt Readings from Last 3 Encounters:  08/29/23 277 lb 2 oz (125.7 kg)  08/23/22 282 lb (127.9 kg)  08/17/21 287 lb (130.2 kg)      Physical Exam Constitutional:      General: He is not in acute distress.    Appearance: Normal appearance. He is well-developed. He is not ill-appearing or toxic-appearing.  HENT:     Head: Normocephalic and atraumatic.     Right Ear: Hearing, tympanic membrane, ear canal and external ear normal.     Left Ear: Hearing, tympanic membrane, ear canal and external ear normal.     Nose: Nose normal.     Mouth/Throat:     Pharynx: Uvula midline.  Eyes:     General: Lids are normal. Lids are everted, no foreign bodies appreciated.     Conjunctiva/sclera: Conjunctivae normal.     Pupils: Pupils are equal, round, and reactive to light.  Neck:     Thyroid: No thyroid mass or thyromegaly.     Vascular: No carotid bruit.     Trachea: Trachea and phonation normal.  Cardiovascular:     Rate and Rhythm: Regular rhythm. Tachycardia present.     Pulses: Normal pulses.     Heart sounds: S1 normal and S2 normal. Heart sounds not distant. No murmur heard.    No friction rub. No gallop.     Comments: No peripheral edema Pulmonary:     Effort: Pulmonary effort is normal. No respiratory distress.     Breath sounds: Normal breath sounds. No wheezing, rhonchi or rales.  Abdominal:     General: Bowel sounds are normal.     Palpations: Abdomen is soft.     Tenderness: There is no abdominal tenderness. There is no guarding or rebound.     Hernia: No hernia is present.  Musculoskeletal:     Cervical back:  Normal range of motion and neck supple.  Lymphadenopathy:     Cervical: No cervical adenopathy.  Skin:    General: Skin is warm and dry.     Findings: No rash.  Neurological:     Mental Status: He is alert.     Cranial Nerves: No cranial nerve deficit.     Sensory: No sensory deficit.     Gait: Gait normal.     Deep Tendon Reflexes: Reflexes are normal and symmetric.  Psychiatric:        Speech: Speech normal.        Behavior: Behavior normal.        Thought Content: Thought content normal.        Judgment: Judgment normal.       Results for orders placed or performed in visit on 08/22/23  Lipid panel   Collection Time: 08/22/23  9:48 AM  Result Value Ref Range   Cholesterol 110 0 - 200 mg/dL   Triglycerides 161.0 0.0 - 149.0 mg/dL   HDL 96.04 (L) >54.09 mg/dL   VLDL 81.1 0.0 - 91.4 mg/dL   LDL Cholesterol 58 0 - 99 mg/dL   Total CHOL/HDL Ratio 4    NonHDL 80.23   Hemoglobin A1c   Collection Time: 08/22/23  9:48 AM  Result Value Ref Range   Hgb A1c MFr Bld 6.2 4.6 - 6.5 %  Comprehensive metabolic panel   Collection Time: 08/22/23  9:48 AM  Result Value Ref Range   Sodium 141 135 - 145 mEq/L   Potassium 4.2 3.5 - 5.1 mEq/L   Chloride 104 96 - 112 mEq/L   CO2 27 19 - 32 mEq/L   Glucose, Bld 80 70 - 99 mg/dL   BUN 22 6 - 23 mg/dL   Creatinine, Ser 7.82 0.40 - 1.50 mg/dL   Total Bilirubin 0.5 0.2 - 1.2 mg/dL   Alkaline Phosphatase 56 39 - 117 U/L   AST 12 0 - 37 U/L   ALT 20 0 - 53 U/L   Total Protein 8.2 6.0 - 8.3 g/dL   Albumin 4.9 3.5 - 5.2 g/dL   GFR 956.21 >30.86 mL/min   Calcium 9.7 8.4 - 10.5 mg/dL    This visit occurred during the SARS-CoV-2 public health emergency.  Safety protocols were in place, including screening questions prior to the visit, additional usage of staff PPE, and extensive cleaning of exam room while observing appropriate contact time as indicated for disinfecting solutions.   COVID 19 screen:  No recent travel or known exposure to  COVID19 The patient denies respiratory symptoms of COVID 19 at this time. The importance of social distancing was discussed today.   Assessment and Plan   The patient's preventative maintenance and recommended screening tests for an annual wellness exam were reviewed in full today. Brought up to date unless services declined.  Counselled on the importance of diet, exercise, and its role in overall health and mortality. The patient's FH and SH was reviewed, including their home life, tobacco status, and drug and alcohol status.   Tdap: 08/2021 COVID x 3   no family history of prostate cancer, colon cancer  Hep C: done  No family history colon cancer Problem List Items Addressed This Visit     Benign essential hypertension   Chronic, diet controlled       Relevant Medications   fenofibrate 160 MG tablet   GAD (generalized anxiety disorder)   Chronic, well controlled Continue sertraline  50 mg daily  Using xanax  30 tab in a year. Rarely using at night for sleep and anxiety.          Hyperlipidemia    Chronic, excellent control on fenofibrate.      Relevant Medications   fenofibrate 160 MG tablet   Morbid obesity (HCC)   Chronic, mproved  Counseled in detail on lifestyle changes as well as obesity related comorbidities that he already has an evidence.      Prediabetes  Chronic control      Routine general medical examination at a health care facility - Primary   Sinus tachycardia    Continue tachycardia... pt asymptomatic.. no further syncopal spells. Unremarkable EKG last year.   Will  eval further with next lab draw or if symptomatic... with TSH, cbc.  Consider  Zio monitor.       Skin lesion of neck    See photo..  several skin tags, one leison darker and larger.. refer to Derm.      Relevant Orders   Ambulatory referral to Dermatology   Other Visit Diagnoses       Acute left-sided low back pain without sciatica       Relevant Medications    cyclobenzaprine (FLEXERIL) 10 MG tablet      Kerby Nora, MD

## 2023-08-29 NOTE — Assessment & Plan Note (Signed)
Chronic, diet controlled. 

## 2023-09-18 ENCOUNTER — Other Ambulatory Visit: Payer: Self-pay | Admitting: Family Medicine

## 2023-10-08 ENCOUNTER — Telehealth: Payer: Self-pay

## 2023-10-08 NOTE — Telephone Encounter (Signed)
 Copied from CRM [id number]. Topic: Referral - Question >> Oct 08, 2023 12:16 PM Albert Merritt wrote: Reason for CRM: Patient is needing a new referral to a new dermatologist the one dr referred him too is not accepting new patients.

## 2023-10-09 NOTE — Telephone Encounter (Signed)
 Will await recs from referral team.

## 2023-10-10 ENCOUNTER — Other Ambulatory Visit: Payer: Self-pay | Admitting: Family Medicine

## 2023-12-08 ENCOUNTER — Other Ambulatory Visit: Payer: Self-pay | Admitting: Family Medicine

## 2024-05-05 ENCOUNTER — Other Ambulatory Visit: Payer: Self-pay | Admitting: Family Medicine

## 2024-08-27 ENCOUNTER — Other Ambulatory Visit

## 2024-09-03 ENCOUNTER — Encounter: Admitting: Family Medicine
